# Patient Record
Sex: Female | Born: 1995 | Race: Black or African American | Hispanic: No | Marital: Single | Smoking: Former smoker
Health system: Southern US, Community
[De-identification: ages and names within clinical notes are randomized; demographics above are authoritative.]

## PROBLEM LIST (undated history)

## (undated) DIAGNOSIS — G43909 Migraine, unspecified, not intractable, without status migrainosus: Secondary | ICD-10-CM

---
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
## 2016-05-06 ENCOUNTER — Encounter (HOSPITAL_BASED_OUTPATIENT_CLINIC_OR_DEPARTMENT_OTHER): Payer: Self-pay | Admitting: Emergency Medicine

## 2016-05-06 ENCOUNTER — Emergency Department (HOSPITAL_BASED_OUTPATIENT_CLINIC_OR_DEPARTMENT_OTHER): Payer: BLUE CROSS/BLUE SHIELD

## 2016-05-06 ENCOUNTER — Emergency Department (HOSPITAL_BASED_OUTPATIENT_CLINIC_OR_DEPARTMENT_OTHER)
Admission: EM | Admit: 2016-05-06 | Discharge: 2016-05-06 | Disposition: A | Discharge status: Home / Self Care | Payer: BLUE CROSS/BLUE SHIELD | Attending: Emergency Medicine | Admitting: Emergency Medicine

## 2016-05-06 DIAGNOSIS — R0781 Pleurodynia: Secondary | ICD-10-CM | POA: Insufficient documentation

## 2016-05-06 DIAGNOSIS — O26899 Other specified pregnancy related conditions, unspecified trimester: Secondary | ICD-10-CM | POA: Insufficient documentation

## 2016-05-06 DIAGNOSIS — Z3490 Encounter for supervision of normal pregnancy, unspecified, unspecified trimester: Secondary | ICD-10-CM

## 2016-05-06 LAB — PREGNANCY, URINE: Preg Test, Ur: POSITIVE — AB

## 2016-05-06 MED ORDER — IBUPROFEN 400 MG PO TABS: 600.0000 mg | ORAL_TABLET | Freq: Once | ORAL | Status: DC

## 2016-05-06 MED ORDER — ACETAMINOPHEN 500 MG PO TABS
1000.0000 mg | ORAL_TABLET | Freq: Once | ORAL | Status: AC
  Administered 2016-05-06: 1000 mg via ORAL
  Filled 2016-05-06: qty 2

## 2016-05-06 NOTE — ED Triage Notes (Addendum)
Pt reports pain to R lower rib area, just under her bra strap, x 1 week. Pt reports recent sickness with forceful coughing and feels this is related. Pt also concerned of possible pregnancy

## 2016-05-06 NOTE — Discharge Instructions (Signed)
We believe that your symptoms are caused by musculoskeletal strain.  Please read through the included information about additional care such as heating pads, over-the-counter pain medicine.  If you were provided a prescription please use it only as needed and as instructed.  Remember that early mobility and using the affected part of your body is actually better than keeping it immobile.  You also tested positive for pregnancy. You will need to see an Ob/Gyn to establish care. Do not take Motrin while pregnant.   Follow-up with the doctor listed as recommended or return to the emergency department with new or worsening symptoms that concern you.

## 2016-05-06 NOTE — ED Provider Notes (Signed)
Emergency Department Provider Note   I have reviewed the triage vital signs and the nursing notes.   HISTORY  Chief Complaint Rib pain   HPI Yolanda Tate is a 21 y.o. female with no significant PMH presents to the emergency room in for evaluation of right-sided rib pain in the setting of recent URI and frequent coughing. Patient states that she is recovered from her cold and coughing has decreased significantly. She has noticed persistent right anterior rib pain. She is not taken any Tylenol or Motrin at home for these symptoms. She denies any suddenly worsening productive cough or fevers. No pain in any other location. Patient is also concerned that she may be pregnant like that evaluated as well. No abdominal pain.   History reviewed. No pertinent past medical history.  There are no active problems to display for this patient.   History reviewed. No pertinent surgical history.  Current Outpatient Rx  . Order #: [id number] Class: Print    Allergies Patient has no known allergies.  No family history on file.  Social History Social History  Substance Use Topics  . Smoking status: Never Smoker  . Smokeless tobacco: Never Used  . Alcohol use No    Review of Systems  Constitutional: No fever/chills Eyes: No visual changes. ENT: No sore throat. Cardiovascular: Denies chest pain. Respiratory: Denies shortness of breath. Gastrointestinal: No abdominal pain.  No nausea, no vomiting.  No diarrhea.  No constipation. Genitourinary: Negative for dysuria. Musculoskeletal: Negative for back pain. Positive right rib pain.  Skin: Negative for rash. Neurological: Negative for headaches, focal weakness or numbness.  10-point ROS otherwise negative.  ____________________________________________   PHYSICAL EXAM:  VITAL SIGNS: ED Triage Vitals  Enc Vitals Group     BP 05/06/16 0904 120/77     Pulse Rate 05/06/16 0904 105     Resp 05/06/16 0904 18     Temp 05/06/16  0904 98.2 F (36.8 C)     Temp Source 05/06/16 0904 Oral     SpO2 05/06/16 0904 100 %     Pain Score 05/06/16 0912 5   Constitutional: Alert and oriented. Well appearing and in no acute distress. Eyes: Conjunctivae are normal.  Head: Atraumatic. Nose: No congestion/rhinnorhea. Mouth/Throat: Mucous membranes are moist.  Oropharynx non-erythematous. Neck: No stridor.   Cardiovascular: Normal rate, regular rhythm. Good peripheral circulation. Grossly normal heart sounds.   Respiratory: Normal respiratory effort.  No retractions. Lungs CTAB. Gastrointestinal: Soft and nontender. No distention.  Musculoskeletal: No lower extremity tenderness nor edema. No gross deformities of extremities. Tenderness to palpation of the right anterior chest wall. No deformity, paradoxical movement, or bruising.  Neurologic:  Normal speech and language. No gross focal neurologic deficits are appreciated.  Skin:  Skin is warm, dry and intact. No rash noted.  ____________________________________________   LABS (all labs ordered are listed, but only abnormal results are displayed)  Labs Reviewed  PREGNANCY, URINE - Abnormal; Notable for the following:       Result Value   Preg Test, Ur POSITIVE (*)    All other components within normal limits   ____________________________________________  RADIOLOGY  Dg Ribs Unilateral W/chest Right  Result Date: 05/06/2016 CLINICAL DATA:  Cough for 2 weeks with onset of right rib pain from coughing 1 week ago. EXAM: RIGHT RIBS AND CHEST - 3+ VIEW COMPARISON:  None. FINDINGS: Lungs are clear. No pneumothorax or pleural effusion. Heart size is normal. No fracture or other bony abnormality. IMPRESSION: Negative examination. Electronically Signed  By: Drusilla Kannerhomas  Dalessio M.D.   On: 05/06/2016 09:38    ____________________________________________   PROCEDURES  Procedure(s) performed:   Procedures  None ____________________________________________   INITIAL  IMPRESSION / ASSESSMENT AND PLAN / ED COURSE  Pertinent labs & imaging results that were available during my care of the patient were reviewed by me and considered in my medical decision making (see chart for details).  Patient resents to the emergency department for evaluation of right anterior chest wall tenderness in the setting of recent coughing illness. Patient also concerned about possible pregnancy. Plan to obtain dedicated rib films of the right anterior chest and urine pregnancy test. Will give Tylenol for pain.  09:52 AM Patient has positive pregnancy test. X-ray negative for fracture. Advised Tylenol and warm compresses at home for pain. Provided follow-up OB/GYN care for the patient to call for an appointment today. Specifically discussed not using Motrin early pregnancy.   At this time, I do not feel there is any life-threatening condition present. I have reviewed and discussed all results (EKG, imaging, lab, urine as appropriate), exam findings with patient. I have reviewed nursing notes and appropriate previous records.  I feel the patient is safe to be discharged home without further emergent workup. Discussed usual and customary return precautions. Patient and family (if present) verbalize understanding and are comfortable with this plan.  Patient will follow-up with their primary care provider. If they do not have a primary care provider, information for follow-up has been provided to them. All questions have been answered.  ____________________________________________  FINAL CLINICAL IMPRESSION(S) / ED DIAGNOSES  Final diagnoses:  Rib pain on right side  Pregnancy, unspecified gestational age     MEDICATIONS GIVEN DURING THIS VISIT:  Medications  acetaminophen (TYLENOL) tablet 1,000 mg (1,000 mg Oral Given 05/06/16 0924)     NEW OUTPATIENT MEDICATIONS STARTED DURING THIS VISIT:  None   Note:  This document was prepared using Dragon voice recognition software and  may include unintentional dictation errors.  Alona BeneJoshua Omarion Minnehan, MD Emergency Medicine    Maia PlanJoshua G Cola Gane, MD 05/06/16 [phone]

## 2017-05-14 ENCOUNTER — Encounter (HOSPITAL_BASED_OUTPATIENT_CLINIC_OR_DEPARTMENT_OTHER): Payer: Self-pay | Admitting: Emergency Medicine

## 2017-05-14 ENCOUNTER — Other Ambulatory Visit: Payer: Self-pay

## 2017-05-14 ENCOUNTER — Emergency Department (HOSPITAL_BASED_OUTPATIENT_CLINIC_OR_DEPARTMENT_OTHER)
Admission: EM | Admit: 2017-05-14 | Discharge: 2017-05-14 | Disposition: A | Discharge status: Home / Self Care | Payer: BLUE CROSS/BLUE SHIELD | Attending: Emergency Medicine | Admitting: Emergency Medicine

## 2017-05-14 DIAGNOSIS — R1032 Left lower quadrant pain: Secondary | ICD-10-CM | POA: Diagnosis not present

## 2017-05-14 DIAGNOSIS — N939 Abnormal uterine and vaginal bleeding, unspecified: Secondary | ICD-10-CM | POA: Insufficient documentation

## 2017-05-14 LAB — URINALYSIS, ROUTINE W REFLEX MICROSCOPIC
BILIRUBIN URINE: NEGATIVE
GLUCOSE, UA: NEGATIVE mg/dL
Ketones, ur: NEGATIVE mg/dL
Leukocytes, UA: NEGATIVE
Nitrite: NEGATIVE
PH: 6.5 (ref 5.0–8.0)
Protein, ur: NEGATIVE mg/dL
SPECIFIC GRAVITY, URINE: 1.025 (ref 1.005–1.030)

## 2017-05-14 LAB — URINALYSIS, MICROSCOPIC (REFLEX): WBC, UA: NONE SEEN WBC/hpf (ref 0–5)

## 2017-05-14 LAB — CBC
HCT: 34.2 % — ABNORMAL LOW (ref 36.0–46.0)
Hemoglobin: 11.7 g/dL — ABNORMAL LOW (ref 12.0–15.0)
MCH: 27.1 pg (ref 26.0–34.0)
MCHC: 34.2 g/dL (ref 30.0–36.0)
MCV: 79.4 fL (ref 78.0–100.0)
Platelets: 276 10*3/uL (ref 150–400)
RBC: 4.31 MIL/uL (ref 3.87–5.11)
RDW: 16.6 % — AB (ref 11.5–15.5)
WBC: 5.9 10*3/uL (ref 4.0–10.5)

## 2017-05-14 LAB — COMPREHENSIVE METABOLIC PANEL
ALK PHOS: 39 U/L (ref 38–126)
ALT: 10 U/L — ABNORMAL LOW (ref 14–54)
AST: 21 U/L (ref 15–41)
Albumin: 3.8 g/dL (ref 3.5–5.0)
Anion gap: 9 (ref 5–15)
BUN: 9 mg/dL (ref 6–20)
CO2: 22 mmol/L (ref 22–32)
Calcium: 8.8 mg/dL — ABNORMAL LOW (ref 8.9–10.3)
Chloride: 106 mmol/L (ref 101–111)
Creatinine, Ser: 0.7 mg/dL (ref 0.44–1.00)
GFR calc Af Amer: >60 mL/min (ref >60)
Glucose, Bld: 106 mg/dL — ABNORMAL HIGH (ref 65–99)
POTASSIUM: 3.9 mmol/L (ref 3.5–5.1)
Sodium: 137 mmol/L (ref 135–145)
Total Bilirubin: 1 mg/dL (ref 0.3–1.2)
Total Protein: 7.4 g/dL (ref 6.5–8.1)

## 2017-05-14 MED ORDER — FERROUS FUMARATE 29 MG PO TABS: 1.0000 | ORAL_TABLET | Freq: Every day | ORAL | 0 refills | Status: DC

## 2017-05-14 NOTE — ED Notes (Signed)
Pt reports left side abd pain last night while working. Pt states she had a baby last year. Has been on her period with heavy bleeding.

## 2017-05-14 NOTE — ED Notes (Signed)
Pt given d/c instructions as per chart. Rx x 1. Verbalizes understanding. No questions. 

## 2017-05-14 NOTE — ED Notes (Signed)
ED Provider at bedside. Mia, PA

## 2017-05-14 NOTE — Discharge Instructions (Signed)
Your blood work showed a very, very mild anemia today. Your hemoglobin was 11.7 this is most likely due to your heavy menstrual cycle.  Take 1 tablet of iron daily before eating for the next 30 days.  Please schedule a follow-up appointment with your primary care provider in the next month to have your blood count rechecked.  You can apply a heated compress to the abdomen you develop abdominal pain.  Take 600 mg of ibuprofen with food every 8 hours for your symptoms.  If you develop new or worsening symptoms, including a high fever with abdominal pain, continued vaginal bleeding along with shortness of breath, change in the color of your skin, or fatigue, or vomiting and diarrhea, please return to the emergency department for re-evaluation.

## 2017-05-14 NOTE — ED Notes (Addendum)
Alert, NAD, calm, interactive, resps e/u, speaking in clear complete sentences, no dyspnea noted, skin W&D, (denies: pain, sob, NVD, fever, numbness, tingling, dizziness or visual changes). [facility].

## 2017-05-14 NOTE — ED Provider Notes (Signed)
[facility] EMERGENCY DEPARTMENT Provider Note   CSN: [id number] Arrival date & time: 05/14/17  1605     History   Chief Complaint Chief Complaint  Patient presents with  . Vaginal Bleeding    HPI Yolanda Tate is a 22 y.o. female who presents to the emergency department with a chief complaint of vaginal bleeding. LMP 05/11/17. She started on OCPs after giving birth to her son by c-section in 07/18.  She reports that instead of wearing sanitary pads or tampons after giving birth that she will wear Depends.  She states that she will normally soak through 1 dependence daily while she is on her period; however today she has used 2 Depends.   She was seen by her PCP 3 days ago and diagnosed with an left ovarian cyst by transvaginal US after she had been having intermittent LLQ pain for the last month.  She has no abdominal pain at this time, but reports crampy, severe, constant, non-radiating LLQ pain last night while she was at work.  She reports the pain was worse with ambulation and improved by nothing.  She has treated her symptoms with 600 mg of ibuprofen every 8 hours as prescribed by her PCP during her appointment earlier this week.  She denies dizziness, lightheadedness, or dyspnea.  She denies fever, chills, hematuria, dysuria, back pain, vaginal pain, nausea, vomiting, or diarrhea.  She is currently sexually active with one female partner. She denies vaginal discharge.   The history is provided by the patient. No language interpreter was used.    History reviewed. No pertinent past medical history.  There are no active problems to display for this patient.   History reviewed. No pertinent surgical history.  OB History    No data available       Home Medications    Prior to Admission medications   Medication Sig Start Date End Date Taking? Authorizing Provider  Ferrous Fumarate 29 MG TABS Take 1 tablet by mouth daily before breakfast. 05/14/17 06/13/17   Marky Buresh A, PA-C  ondansetron (ZOFRAN) 4 MG tablet Take 1 tablet (4 mg total) by mouth every 6 (six) hours. 05/03/14   Ladona Mow, PA-C    Family History History reviewed. No pertinent family history.  Social History Social History   Tobacco Use  . Smoking status: Never Smoker  . Smokeless tobacco: Never Used  Substance Use Topics  . Alcohol use: No  . Drug use: No     Allergies   Patient has no known allergies.   Review of Systems Review of Systems  Constitutional: Negative for activity change, chills and fever.  HENT: Negative for congestion.   Respiratory: Negative for shortness of breath.   Cardiovascular: Negative for chest pain.  Gastrointestinal: Positive for abdominal pain. Negative for blood in stool, diarrhea, nausea and vomiting.  Genitourinary: Positive for vaginal bleeding. Negative for vaginal discharge and vaginal pain.  Musculoskeletal: Negative for back pain.  Skin: Negative for rash.  Allergic/Immunologic: Negative for immunocompromised state.  Neurological: Negative for dizziness and light-headedness.     Physical Exam Updated Vital Signs BP 104/61 (BP Location: Right Arm)   Pulse 76   Temp 99.4 F (37.4 C) (Oral)   Resp 18   Ht 5\' 5"  (1.651 m)   Wt 59.9 kg (132 lb)   LMP 05/14/2017   SpO2 100%   BMI 21.97 kg/m   Physical Exam  Constitutional: No distress.  HENT:  Head: Normocephalic.  Eyes: Conjunctivae are normal. No  scleral icterus.  Neck: Normal range of motion. Neck supple.  Cardiovascular: Normal rate, regular rhythm, normal heart sounds and intact distal pulses. Exam reveals no gallop and no friction rub.  No murmur heard. Pulmonary/Chest: Effort normal and breath sounds normal. No stridor. No respiratory distress. She has no wheezes. She has no rales. She exhibits no tenderness.  Abdominal: Soft. Bowel sounds are normal. She exhibits no distension and no mass. There is no rebound and no guarding. No hernia.  Benign abdominal  exam without focal tenderness.   Lymphadenopathy:    She has no cervical adenopathy.  Neurological: She is alert.  Skin: Skin is warm. Capillary refill takes less than 2 seconds. No rash noted. She is not diaphoretic.  Psychiatric: Her behavior is normal.  Nursing note and vitals reviewed.    ED Treatments / Results  Labs (all labs ordered are listed, but only abnormal results are displayed) Labs Reviewed  CBC - Abnormal; Notable for the following components:      Result Value   Hemoglobin 11.7 (*)    HCT 34.2 (*)    RDW 16.6 (*)    All other components within normal limits  URINALYSIS, ROUTINE W REFLEX MICROSCOPIC - Abnormal; Notable for the following components:   Hgb urine dipstick MODERATE (*)    All other components within normal limits  COMPREHENSIVE METABOLIC PANEL - Abnormal; Notable for the following components:   Glucose, Bld 106 (*)    Calcium 8.8 (*)    ALT 10 (*)    All other components within normal limits  URINALYSIS, MICROSCOPIC (REFLEX) - Abnormal; Notable for the following components:   Bacteria, UA MANY (*)    Squamous Epithelial / LPF 0-5 (*)    All other components within normal limits    EKG  EKG Interpretation None       Radiology No results found.  Procedures Procedures (including critical care time)  Medications Ordered in ED Medications - No data to display   Initial Impression / Assessment and Plan / ED Course  I have reviewed the triage vital signs and the nursing notes.  Pertinent labs & imaging results that were available during my care of the patient were reviewed by me and considered in my medical decision making (see chart for details).     22 year old female presenting with abnormal uterine bleeding. She used two Depends today instead of one. She was diagnosed with a left ovarian cyst 4 days ago and had crampy abdominal pain last night at work, which has since resolved. UA with bacturia, but otherwise unremarkable. No urinary  sx, will not treat at this time. No leukocytosis. Hgb 11.7; previous Hgb 10.5 on 02/08/16 via [facility]. Labs are otherwise reassuring. Abdominal exam is unrmarkable. Doubt nephrolithiasis, ruptured ovarian cysts, diverticulitis, or colitis at this time. She is hemodynamically stable. NAD. Strict return precautions given. The patient is safe for d/c at this time.   Final Clinical Impressions(s) / ED Diagnoses   Final diagnoses:  Vaginal bleeding    ED Discharge Orders        Ordered    Ferrous Fumarate 29 MG TABS  Daily before breakfast     05/14/17 2045       Frederik PearMcDonald, Mercedez Boule A, PA-C 05/15/17 0322    Maia PlanLong, Joshua G, MD 05/15/17 1011

## 2017-05-14 NOTE — ED Triage Notes (Addendum)
Patient states that she is having vaginal bleeding - the patient states that she is saturating 2 adult diapers in a 24 hour period. Patient states that  Today is her last day of her period. Reports that she is having abdominal cramping as well. Denies any Dizziness or being lightheaded -  Patient was seen on [facility] and told that she has a cyst on her left ovary and was treated with Motrin

## 2017-09-24 ENCOUNTER — Other Ambulatory Visit: Payer: Self-pay

## 2017-09-24 ENCOUNTER — Emergency Department (HOSPITAL_BASED_OUTPATIENT_CLINIC_OR_DEPARTMENT_OTHER)
Admission: EM | Admit: 2017-09-24 | Discharge: 2017-09-24 | Disposition: A | Discharge status: Home / Self Care | Payer: Medicaid Other | Attending: Emergency Medicine | Admitting: Emergency Medicine

## 2017-09-24 ENCOUNTER — Encounter (HOSPITAL_BASED_OUTPATIENT_CLINIC_OR_DEPARTMENT_OTHER): Payer: Self-pay | Admitting: Emergency Medicine

## 2017-09-24 DIAGNOSIS — H11431 Conjunctival hyperemia, right eye: Secondary | ICD-10-CM | POA: Diagnosis present

## 2017-09-24 DIAGNOSIS — H1031 Unspecified acute conjunctivitis, right eye: Secondary | ICD-10-CM | POA: Diagnosis not present

## 2017-09-24 MED ORDER — POLYMYXIN B-TRIMETHOPRIM 10000-0.1 UNIT/ML-% OP SOLN: 1.0000 [drp] | OPHTHALMIC | 0 refills | Status: DC

## 2017-09-24 MED ORDER — TETRACAINE HCL 0.5 % OP SOLN
2.0000 [drp] | Freq: Once | OPHTHALMIC | Status: AC
  Administered 2017-09-24: 2 [drp] via OPHTHALMIC
  Filled 2017-09-24: qty 4

## 2017-09-24 MED ORDER — FLUORESCEIN SODIUM 1 MG OP STRP
1.0000 | ORAL_STRIP | Freq: Once | OPHTHALMIC | Status: AC
  Administered 2017-09-24: 1 via OPHTHALMIC
  Filled 2017-09-24: qty 1

## 2017-09-24 NOTE — Discharge Instructions (Signed)
Please apply antibiotic drops every 4 hours to right eye.  Remember that pinkeye is contagious and wash your hands frequently.  You can use warm compresses to help with drainage.  Follow-up with your eye doctor if your symptoms are not improving in 2 to 3 days.  Return to the emergency department if you have any new or concerning symptoms like trouble with your vision, fever greater than 100.4 F, worsening pain.

## 2017-09-24 NOTE — ED Triage Notes (Signed)
R eye pain and redness x 2 days.

## 2017-09-24 NOTE — ED Provider Notes (Signed)
[facility] EMERGENCY DEPARTMENT Provider Note   CSN: [id number] Arrival date & time: 09/24/17  1035     History   Chief Complaint Chief Complaint  Patient presents with  . Eye Pain    HPI Yolanda Tate is a 22 y.o. female.  HPI   Yolanda Tate is a 22yo female with no significant past medical history presents emergency department for evaluation of right eye.  Patient reports that she noticed her right eye was red yesterday morning.  She reports some mild soreness in the eye, about a 3/10 in severity.  States that she is also had some clear drainage and has been rubbing the eye.  Denies trouble with her vision.  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  She denies any recent eye trauma or feeling of something stuck in that eye.  She is not a contact lens wearer.  States that she occasionally wears glasses.  Denies previous eye surgeries.  Denies fevers, chills, photophobia, headache, painful EOMs.  History reviewed. No pertinent past medical history.  There are no active problems to display for this patient.   Past Surgical History:  Procedure Laterality Date  . CESAREAN SECTION       OB History   None      Home Medications    Prior to Admission medications   Medication Sig Start Date End Date Taking? Authorizing Provider  Ferrous Fumarate 29 MG TABS Take 1 tablet by mouth daily before breakfast. 05/14/17 06/13/17  McDonald, Mia A, PA-C  ondansetron (ZOFRAN) 4 MG tablet Take 1 tablet (4 mg total) by mouth every 6 (six) hours. 05/03/14   Ladona Mow, PA-C    Family History No family history on file.  Social History Social History   Tobacco Use  . Smoking status: Never Smoker  . Smokeless tobacco: Never Used  Substance Use Topics  . Alcohol use: No  . Drug use: No     Allergies   Patient has no known allergies.   Review of Systems Review of Systems    Constitutional: Negative for chills and fever.  HENT: Negative for facial swelling.   Eyes: Positive for pain, discharge and redness. Negative for photophobia and visual disturbance.  Gastrointestinal: Negative for nausea and vomiting.  Neurological: Negative for headaches.     Physical Exam Updated Vital Signs BP 104/71 (BP Location: Right Arm)   Pulse 96   Temp 99.7 F (37.6 C) (Oral)   Resp 18   Ht  (1.651 m)   Wt 59 kg (130 lb)   LMP 08/31/2017   SpO2 99%   BMI 21.63 kg/m   Physical Exam  Constitutional: She appears well-developed and well-nourished. No distress.  HENT:  Head: Normocephalic and atraumatic.  Eyes: Right eye exhibits no discharge. Left eye exhibits no discharge.  Visual Acuity: 20/20RE, 20/20LE Appearance. Right eye with conjunctival injection and clear drainage.  Left eye without conjunctival injection. PERRL intact. EOMI without nystagmus or pain. No consensual photophobia.  Corneal Abrasion Exam VCO. Risks, benefits and alternatives explained. 2 drops of tetracaine (PONTOCAINE) 0.5 % ophthalmic solution were applied to the right eye. Fluorescein 1 MG ophthalmic strip applied the the surface of the right eye Slit lamp used to screen for abrasion. No increased fluorescein uptake. No corneal ulcer. No hyphema. No dendritic lesion. Negative Seidel sign. No foreign bodies noted. Patient tolerated the procedure well  Pulmonary/Chest:  Effort normal. No respiratory distress.  Neurological: She is alert. Coordination normal.  Skin: She is not diaphoretic.  Psychiatric: She has a normal mood and affect. Her behavior is normal.  Nursing note and vitals reviewed.    ED Treatments / Results  Labs (all labs ordered are listed, but only abnormal results are displayed) Labs Reviewed - No data to display  EKG None  Radiology No results found.  Procedures Procedures (including critical care time)  Medications Ordered in ED Medications - No data to  display   Initial Impression / Assessment and Plan / ED Course  I have reviewed the triage vital signs and the nursing notes.  Pertinent labs & imaging results that were available during my care of the patient were reviewed by me and considered in my medical decision making (see chart for details).     Patient presentation consistent with conjunctivitis.  No corneal abrasions, entrapment, consensual photophobia, or dendritic staining with fluorescein study.  Presentation non-concerning for iritis, corneal abrasion, corneal ulcer, or HSV.  She is not a contact lens wearer. Will cover for bacterial conjunctivitis with polytrim drops.  Personal hygiene and frequent handwashing discussed.  Patient advised to followup with ophthalmologist if symptoms persist or worsen in any way including vision change or purulent discharge.  Patient verbalizes understanding and is agreeable with discharge.  Final Clinical Impressions(s) / ED Diagnoses   Final diagnoses:  Acute conjunctivitis of right eye, unspecified acute conjunctivitis type    ED Discharge Orders        Ordered    trimethoprim-polymyxin b (POLYTRIM) ophthalmic solution  Every 4 hours     09/24/17 1417       Kellie Shropshire, PA-C 09/24/17 1424    Tilden Fossa, MD 09/25/17 [phone]

## 2017-10-26 ENCOUNTER — Other Ambulatory Visit: Payer: Self-pay

## 2017-10-26 ENCOUNTER — Encounter (HOSPITAL_BASED_OUTPATIENT_CLINIC_OR_DEPARTMENT_OTHER): Payer: Self-pay | Admitting: *Deleted

## 2017-10-26 ENCOUNTER — Emergency Department (HOSPITAL_BASED_OUTPATIENT_CLINIC_OR_DEPARTMENT_OTHER)
Admission: EM | Admit: 2017-10-26 | Discharge: 2017-10-26 | Disposition: A | Discharge status: Home / Self Care | Payer: Medicaid Other | Attending: Emergency Medicine | Admitting: Emergency Medicine

## 2017-10-26 DIAGNOSIS — R6884 Jaw pain: Secondary | ICD-10-CM | POA: Diagnosis present

## 2017-10-26 DIAGNOSIS — M26623 Arthralgia of bilateral temporomandibular joint: Secondary | ICD-10-CM | POA: Insufficient documentation

## 2017-10-26 MED ORDER — IBUPROFEN 400 MG PO TABS
600.0000 mg | ORAL_TABLET | Freq: Once | ORAL | Status: AC
  Administered 2017-10-26: 600 mg via ORAL
  Filled 2017-10-26: qty 1

## 2017-10-26 NOTE — Discharge Instructions (Signed)
Please take Ibuprofen or Aleve for pain Please follow up with your doctor Avoid crunchy, chewy, or sticky foods for the next couple days until your pain improves

## 2017-10-26 NOTE — ED Provider Notes (Signed)
[facility] EMERGENCY DEPARTMENT Provider Note   CSN: [id number] Arrival date & time: 10/26/17  1715     History   Chief Complaint Chief Complaint  Patient presents with  . Jaw Pain    HPI Rada Zegers is a 22 y.o. female who presents with bilateral jaw pain.  Past medical history significant for TMJ disorder.  She states she went to the dentist a couple days ago for filling and when she left the office she felt her L jaw pop. She reports pain over the bilateral TMJ joints. She denies dental pain or facial swelling, or trauma. She states she just wanted to get it checked out because 1 year ago her doctor told her she may have to get a rod in her jaw. She's been taking Tylenol for pain.  HPI  History reviewed. No pertinent past medical history.  There are no active problems to display for this patient.   Past Surgical History:  Procedure Laterality Date  . CESAREAN SECTION       OB History   None      Home Medications    Prior to Admission medications   Not on File    Family History History reviewed. No pertinent family history.  Social History Social History   Tobacco Use  . Smoking status: Never Smoker  . Smokeless tobacco: Never Used  Substance Use Topics  . Alcohol use: No  . Drug use: No     Allergies   Patient has no known allergies.   Review of Systems Review of Systems  HENT: Negative for dental problem and facial swelling.   Musculoskeletal: Positive for arthralgias.     Physical Exam Updated Vital Signs BP 108/74   Pulse (!) 109   Temp 99.5 F (37.5 C)   Resp 18   Ht 5\' 5"  (1.651 m)   Wt 59 kg (130 lb)   LMP 10/19/2017   SpO2 100%   BMI 21.63 kg/m   Physical Exam  Constitutional: She is oriented to person, place, and time. She appears well-developed and well-nourished. No distress.  Calm, cooperative. Talking normally.  HENT:  Head: Normocephalic and atraumatic.  Mouth/Throat: No oral lesions. There is  trismus (mild) in the jaw. Abnormal dentition (multiple fillings). No dental abscesses.  Eyes: Pupils are equal, round, and reactive to light. Conjunctivae are normal. Right eye exhibits no discharge. Left eye exhibits no discharge. No scleral icterus.  Neck: Normal range of motion.  Cardiovascular: Normal rate.  Pulmonary/Chest: Effort normal. No respiratory distress.  Abdominal: She exhibits no distension.  Neurological: She is alert and oriented to person, place, and time.  Skin: Skin is warm and dry.  Psychiatric: She has a normal mood and affect. Her behavior is normal.  Nursing note and vitals reviewed.    ED Treatments / Results  Labs (all labs ordered are listed, but only abnormal results are displayed) Labs Reviewed - No data to display  EKG None  Radiology No results found.  Procedures Procedures (including critical care time)  Medications Ordered in ED Medications  ibuprofen (ADVIL,MOTRIN) tablet 600 mg (has no administration in time range)     Initial Impression / Assessment and Plan / ED Course  I have reviewed the triage vital signs and the nursing notes.  Pertinent labs & imaging results that were available during my care of the patient were reviewed by me and considered in my medical decision making (see chart for details).  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 She has mild trismus but otherwise has a normal exam. Recommended NSAIDs and PCP follow up. She was offered imaging of the area but declined.  Final Clinical Impressions(s) / ED Diagnoses   Final diagnoses:  Bilateral temporomandibular joint pain    ED Discharge Orders    None       Bethel BornGekas, Gracianna Vink Marie, PA-C 10/26/17 1821    Melene PlanFloyd, Dan, DO 10/26/17 1914

## 2017-10-26 NOTE — ED Triage Notes (Addendum)
Pt c/o bil jaw pain after dentist visit x 2 days ago . HX TMJ.  Pt did not f/u with dentist

## 2017-12-30 ENCOUNTER — Emergency Department (HOSPITAL_BASED_OUTPATIENT_CLINIC_OR_DEPARTMENT_OTHER)
Admission: EM | Admit: 2017-12-30 | Discharge: 2017-12-31 | Disposition: A | Discharge status: Home / Self Care | Payer: Medicaid Other | Attending: Emergency Medicine | Admitting: Emergency Medicine

## 2017-12-30 ENCOUNTER — Other Ambulatory Visit: Payer: Self-pay

## 2017-12-30 ENCOUNTER — Encounter (HOSPITAL_BASED_OUTPATIENT_CLINIC_OR_DEPARTMENT_OTHER): Payer: Self-pay | Admitting: Adult Health

## 2017-12-30 DIAGNOSIS — R112 Nausea with vomiting, unspecified: Secondary | ICD-10-CM | POA: Diagnosis not present

## 2017-12-30 DIAGNOSIS — R197 Diarrhea, unspecified: Secondary | ICD-10-CM

## 2017-12-30 NOTE — ED Triage Notes (Signed)
PResents with vomiting and diarrhea for 3 days. LAst emesis was at 10 AM today. Currently having diarrhea 4 times per day. Endorses stomach cramping. Denies fevers.

## 2017-12-31 MED ORDER — ONDANSETRON 4 MG PO TBDP: 4.0000 mg | ORAL_TABLET | Freq: Three times a day (TID) | ORAL | 0 refills | Status: AC | PRN

## 2017-12-31 MED ORDER — ONDANSETRON 4 MG PO TBDP: 4.0000 mg | ORAL_TABLET | Freq: Three times a day (TID) | ORAL | 0 refills | Status: DC | PRN

## 2017-12-31 NOTE — ED Provider Notes (Signed)
[facility] EMERGENCY DEPARTMENT Provider Note  CSN: [id number] Arrival date & time: 12/30/17 2230  Chief Complaint(s) Diarrhea  HPI Yolanda Tate is a 22 y.o. female   The history is provided by the patient.  Diarrhea   This is a new problem. Episode onset: 3 days. The problem occurs 2 to 4 times per day. The problem has not changed since onset.The stool consistency is described as watery. There has been no fever. Associated symptoms include abdominal pain (cramping lower abd pain) and vomiting (NBNB, twice). Pertinent negatives include no chills, no myalgias, no URI and no cough.   No recent travel, or sick contacts. Possible suspicious food intake.  Past Medical History History reviewed. No pertinent past medical history. There are no active problems to display for this patient.  Home Medication(s) Prior to Admission medications   Medication Sig Start Date End Date Taking? Authorizing Provider  ondansetron (ZOFRAN ODT) 4 MG disintegrating tablet Take 1 tablet (4 mg total) by mouth every 8 (eight) hours as needed for up to 3 days for nausea or vomiting. 12/31/17 01/03/18  Cardama, Amadeo Garnet, MD                                                                                                                                    Past Surgical History Past Surgical History:  Procedure Laterality Date  . CESAREAN SECTION     Family History History reviewed. No pertinent family history.  Social History Social History   Tobacco Use  . Smoking status: Never Smoker  . Smokeless tobacco: Never Used  Substance Use Topics  . Alcohol use: No  . Drug use: No   Allergies Patient has no known allergies.  Review of Systems Review of Systems  Constitutional: Negative for chills.  Respiratory: Negative for cough.   Gastrointestinal: Positive for abdominal pain (cramping lower abd pain), diarrhea and vomiting (NBNB, twice).  Musculoskeletal: Negative for myalgias.    All other systems are reviewed and are negative for acute change except as noted in the HPI  Physical Exam Vital Signs  I have reviewed the triage vital signs BP 108/68 (BP Location: Left Arm)   Pulse 93   Temp 98.5 F (36.9 C) (Oral)   Resp 18   Ht 5\' 5"  (1.651 m)   Wt 57.6 kg   LMP 12/14/2017   SpO2 98%   BMI 21.12 kg/m   Physical Exam  Constitutional: She is oriented to person, place, and time. She appears well-developed and well-nourished. No distress.  HENT:  Head: Normocephalic and atraumatic.  Right Ear: External ear normal.  Left Ear: External ear normal.  Nose: Nose normal.  Eyes: Conjunctivae and EOM are normal. No scleral icterus.  Neck: Normal range of motion and phonation normal.  Cardiovascular: Normal rate and regular rhythm.  Pulmonary/Chest: Effort normal. No stridor. No respiratory distress.  Abdominal: She exhibits no distension. There is no tenderness. There  is no rigidity, no rebound and no guarding.  Musculoskeletal: Normal range of motion. She exhibits no edema.  Neurological: She is alert and oriented to person, place, and time.  Skin: She is not diaphoretic.  Psychiatric: She has a normal mood and affect. Her behavior is normal.  Vitals reviewed.   ED Results and Treatments Labs (all labs ordered are listed, but only abnormal results are displayed) Labs Reviewed - No data to display                                                                                                                       EKG  EKG Interpretation  Date/Time:    Ventricular Rate:    PR Interval:    QRS Duration:   QT Interval:    QTC Calculation:   R Axis:     Text Interpretation:        Radiology No results found. Pertinent labs & imaging results that were available during my care of the patient were reviewed by me and considered in my medical decision making (see chart for details).  Medications Ordered in ED Medications - No data to display                                                                                                                                   Procedures Procedures  (including critical care time)  Medical Decision Making / ED Course I have reviewed the nursing notes for this encounter and the patient's prior records (if available in EHR or on provided paperwork).    22 y.o. female presents with vomiting, diarrhea, and fever for 3 days.  possible suspicious food intake. No historical evidence to suggest suspicious toxic ingestion or exposure. adequate oral tolerance. Rest of history as above.  Patient appears well, not in distress, and with no signs of toxicity or dehydration. Abdomen benign.  Rest of the exam as above  Most consistent with viral gastroenteritis vs food poisoning.   No evidence of suggestive of cholinergic toxidrome. Doubt appendicitis, diverticulitis, severe colitis, dysentery.    Able to tolerate oral intake in the ED.  Discussed symptomatic treatment with the patient and they will follow closely with their PCP.   Final Clinical Impression(s) / ED Diagnoses Final diagnoses:  Nausea vomiting and diarrhea    Disposition: Discharge  Condition: Good  I have discussed the results, Dx and Tx plan with the patient  who expressed understanding and agree(s) with the plan. Discharge instructions discussed at great length. The patient was given strict return precautions who verbalized understanding of the instructions. No further questions at time of discharge.    ED Discharge Orders         Ordered    ondansetron (ZOFRAN ODT) 4 MG disintegrating tablet  Every 8 hours PRN     12/31/17 0011           Follow Up: Primary care provider   in 5-7 days, If symptoms do not improve or  worsen     This chart was dictated using voice recognition software.  Despite best efforts to proofread,  errors can occur which can change the documentation meaning.   Nira Connardama, Pedro Eduardo, MD 12/31/17  [phone]

## 2018-08-27 IMAGING — DX DG RIBS W/ CHEST 3+V*R*
3 series · 3 of 3 positions shown · non-contrast
Comparison: None.

CLINICAL DATA: Cough for 2 weeks with onset of right rib pain from
coughing 1 week ago.

EXAM:
RIGHT RIBS AND CHEST - 3+ VIEW

[chest pa]
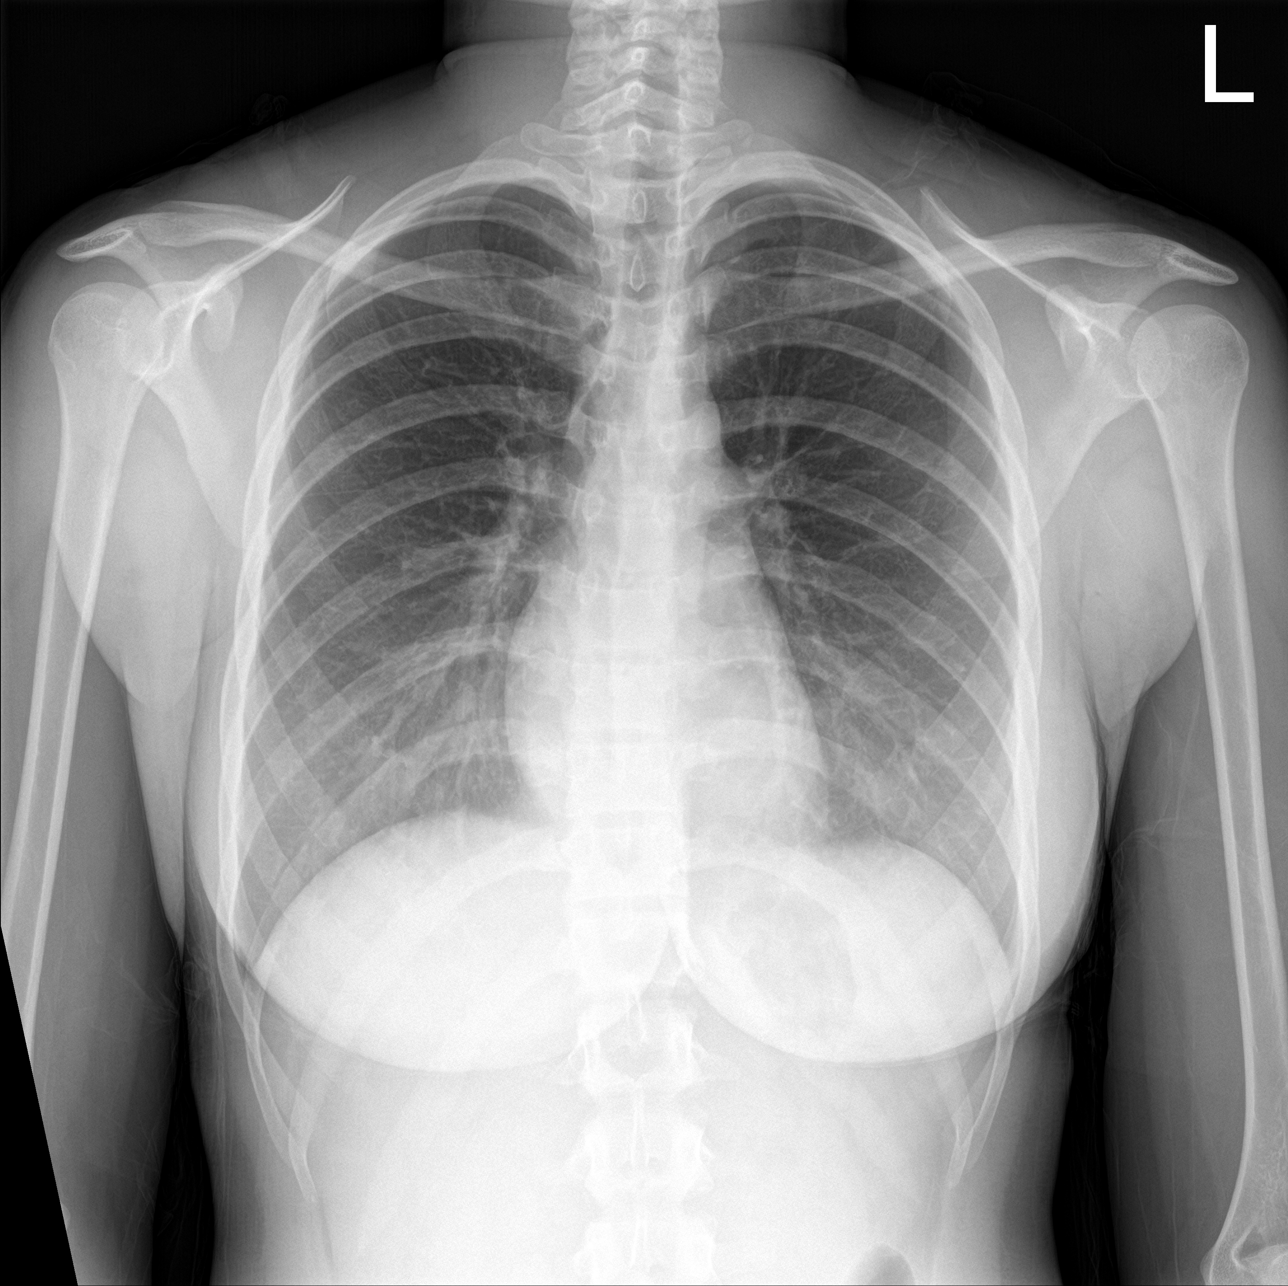

[rib pa]
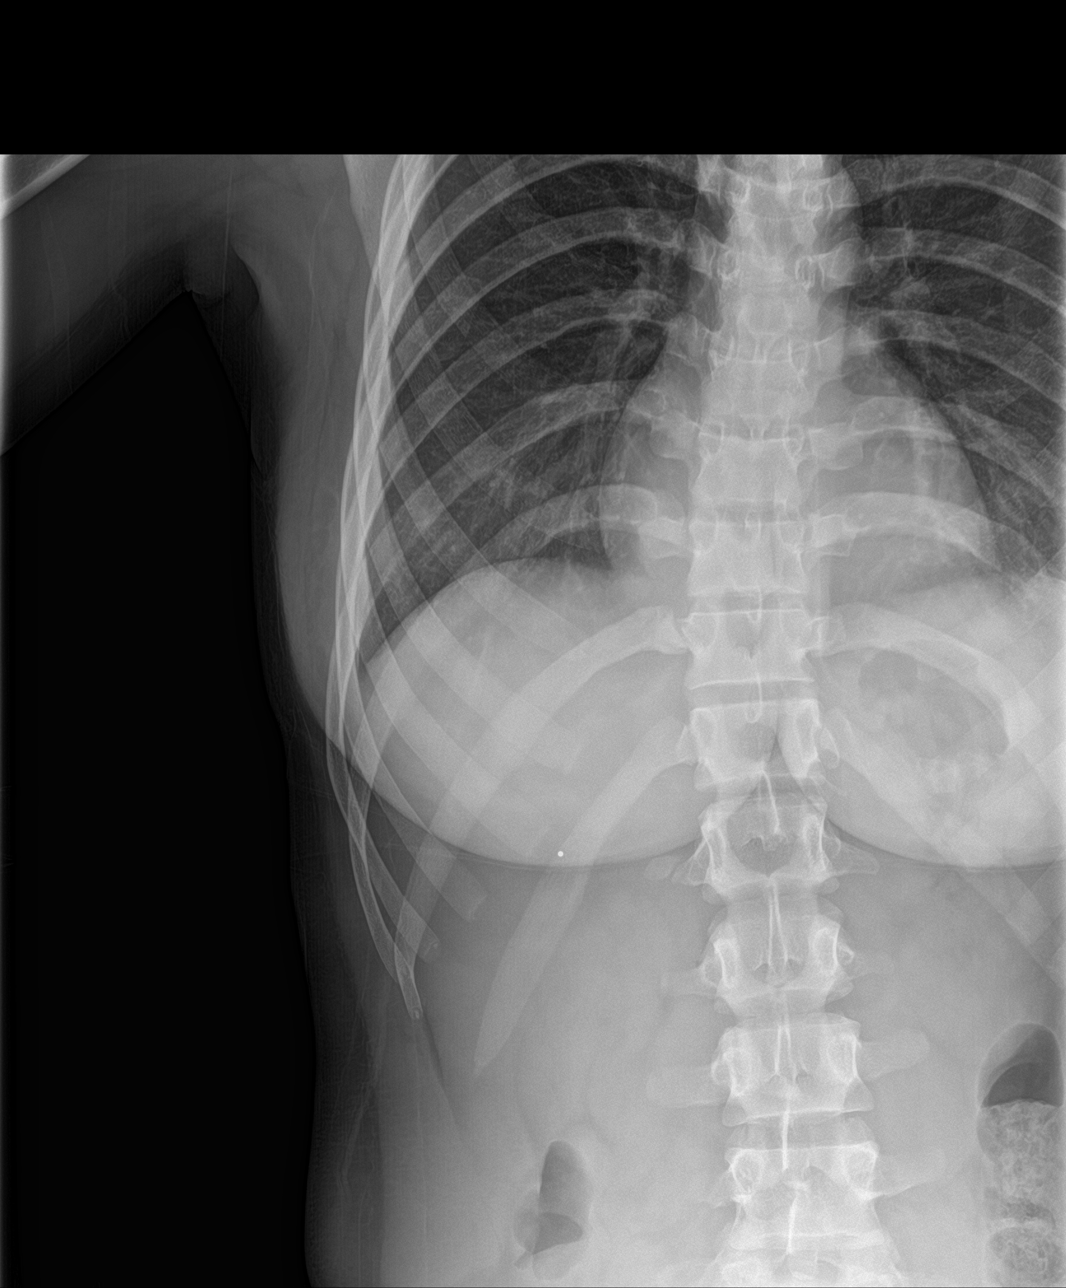

[rib pa obl]
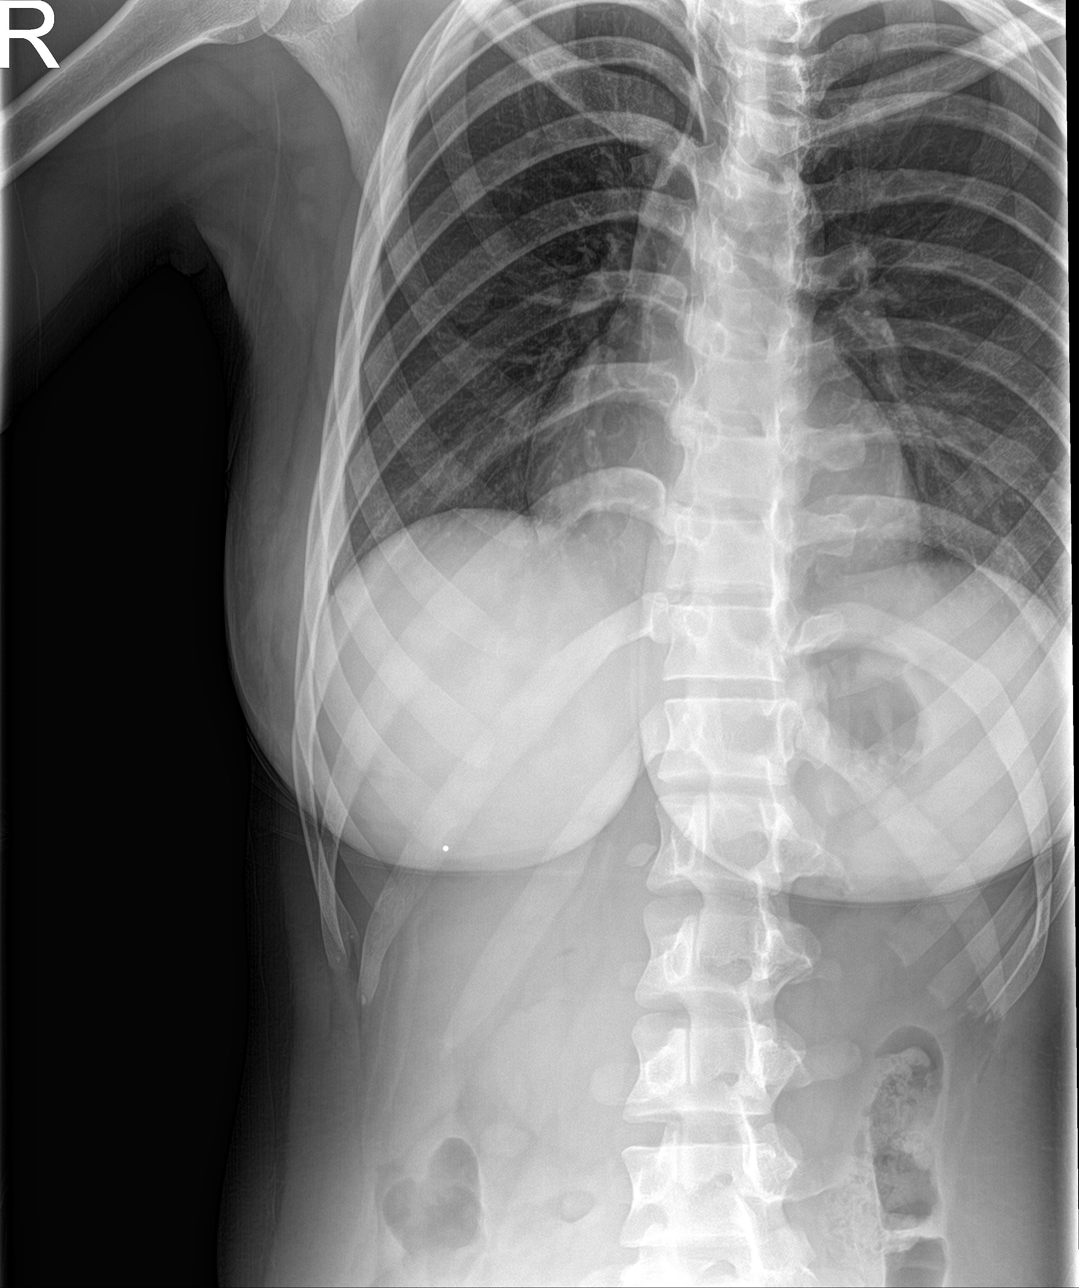

[3 of 3 positions shown; findings below may reference images not displayed]

FINDINGS: Lungs are clear. No pneumothorax or pleural effusion. Heart size is
normal. No fracture or other bony abnormality.
IMPRESSION: Negative examination.

## 2019-06-01 ENCOUNTER — Emergency Department (HOSPITAL_BASED_OUTPATIENT_CLINIC_OR_DEPARTMENT_OTHER)
Admission: EM | Admit: 2019-06-01 | Discharge: 2019-06-01 | Disposition: A | Discharge status: Home / Self Care | Payer: Medicaid Other | Attending: Emergency Medicine | Admitting: Emergency Medicine

## 2019-06-01 ENCOUNTER — Encounter (HOSPITAL_BASED_OUTPATIENT_CLINIC_OR_DEPARTMENT_OTHER): Payer: Self-pay

## 2019-06-01 ENCOUNTER — Other Ambulatory Visit: Payer: Self-pay

## 2019-06-01 DIAGNOSIS — G43909 Migraine, unspecified, not intractable, without status migrainosus: Secondary | ICD-10-CM | POA: Insufficient documentation

## 2019-06-01 DIAGNOSIS — F17200 Nicotine dependence, unspecified, uncomplicated: Secondary | ICD-10-CM | POA: Insufficient documentation

## 2019-06-01 DIAGNOSIS — G43809 Other migraine, not intractable, without status migrainosus: Secondary | ICD-10-CM

## 2019-06-01 HISTORY — DX: Migraine, unspecified, not intractable, without status migrainosus: G43.909

## 2019-06-01 LAB — PREGNANCY, URINE: Preg Test, Ur: NEGATIVE

## 2019-06-01 MED ORDER — KETOROLAC TROMETHAMINE 30 MG/ML IJ SOLN
30.0000 mg | Freq: Once | INTRAMUSCULAR | Status: AC
  Administered 2019-06-01: 30 mg via INTRAMUSCULAR
  Filled 2019-06-01: qty 1

## 2019-06-01 NOTE — Discharge Instructions (Signed)
Please follow up with [facility] for your primary care needs  Take Naproxen as needed for your headaches if it seems to help. Increase your fluid intake to stay hydrated  Return to the ED IMMEDIATELY for any worsening symptoms including worsening headache, blurry vision/double vision, vomiting, neck stiffness, purple rash on your legs, confusion, unilateral weakness or numbness, speech difficulties, fevers > 100.4, or any other concerning symptoms to you.

## 2019-06-01 NOTE — ED Triage Notes (Addendum)
Pt c/o migraine x 2 months-states she was dx at age 24-no meds-denies fever/flu like sx-NAD-steady gait

## 2019-06-01 NOTE — ED Provider Notes (Signed)
[facility] EMERGENCY DEPARTMENT Provider Note   CSN: [id number] Arrival date & time: 06/01/19  1228     History Chief Complaint  Patient presents with  . Migraine    Yolanda Tate is a 24 y.o. female with PMHx migraines who presents to the ED today complaining of gradual onset, intermittent, diffuse migraine headache x 3 months.  She reports that she was diagnosed with migraine headaches around the age of 23-13.  She states that the time she had been taking 600 mg ibuprofen as needed for her headaches which helped.  She reports that she had a child in 2018 and reports that she stopped taking ibuprofen at that time, reports that her migraine headaches resolved while she was pregnant.  She states that they returned about 3 months ago.  She reports that she has been taking naproxen with mild relief however today while at work she felt lightheaded which is not typical for her.  She states that her mom told her to come to the ED to be evaluated.  Patient states that she typically does not have migraine headaches at work and reports that she did not drink a lot of water before going to work.  Patient works at a [employer] and is constantly up and down on her feet.  She reports that the most recent headache started last night and is slowly dissipating, states that this is not the most severe headache she has ever had.  Ports that she did try to take some ibuprofen earlier today without relief.  Patient denies fevers, chills, neck stiffness, rash, vision changes, vomiting, confusion, speech difficulties, unilateral weakness or numbness, any other associated symptoms.  The history is provided by the patient.       Past Medical History:  Diagnosis Date  . Migraine     There are no problems to display for this patient.   Past Surgical History:  Procedure Laterality Date  . CESAREAN SECTION       OB History   No obstetric history on file.     No family history on  file.  Social History   Tobacco Use  . Smoking status: Current Every Day Smoker    Types: Cigars  . Smokeless tobacco: Never Used  Substance Use Topics  . Alcohol use: No  . Drug use: No    Home Medications Prior to Admission medications   Not on File    Allergies    Patient has no known allergies.  Review of Systems   Review of Systems  Constitutional: Negative for chills and fever.  Gastrointestinal: Positive for nausea. Negative for abdominal pain, constipation, diarrhea and vomiting.  Musculoskeletal: Negative for myalgias, neck pain and neck stiffness.  Neurological: Positive for light-headedness and headaches. Negative for dizziness, syncope, speech difficulty, weakness and numbness.  All other systems reviewed and are negative.   Physical Exam Updated Vital Signs BP (!) 115/52 (BP Location: Left Arm)   Pulse 80   Temp 98.8 F (37.1 C) (Oral)   Resp 14   Ht 5\' 5"  (1.651 m)   Wt 63 kg   LMP 05/31/2019   SpO2 100%   BMI 23.13 kg/m   Physical Exam Vitals and nursing note reviewed.  Constitutional:      Appearance: She is not ill-appearing or diaphoretic.  HENT:     Head: Normocephalic and atraumatic.  Eyes:     Conjunctiva/sclera: Conjunctivae normal.  Cardiovascular:     Rate and Rhythm: Normal rate and regular  rhythm.     Pulses: Normal pulses.  Pulmonary:     Effort: Pulmonary effort is normal.     Breath sounds: Normal breath sounds. No wheezing, rhonchi or rales.  Abdominal:     Palpations: Abdomen is soft.     Tenderness: There is no abdominal tenderness.  Musculoskeletal:     Cervical back: Neck supple.  Skin:    General: Skin is warm and dry.  Neurological:     Mental Status: She is alert.     Comments: CN 3-12 grossly intact A&O x4 GCS 15 Sensation and strength intact Gait nonataxic including with tandem walking Coordination with finger-to-nose WNL Neg romberg, neg pronator drift     ED Results / Procedures / Treatments    Labs (all labs ordered are listed, but only abnormal results are displayed) Labs Reviewed  PREGNANCY, URINE    EKG None  Radiology No results found.  Procedures Procedures (including critical care time)  Medications Ordered in ED Medications  ketorolac (TORADOL) 30 MG/ML injection 30 mg (30 mg Intramuscular Given 06/01/19 1458)    ED Course  I have reviewed the triage vital signs and the nursing notes.  Pertinent labs & imaging results that were available during my care of the patient were reviewed by me and considered in my medical decision making (see chart for details).  24 year old female who presents the ED today complaining of intermittent migraines for the past 2 to 3 months.  Diagnosed at the age of 78.  Does not currently have a PCP.  Orts that she began feeling lightheaded this morning prompting her to come to the ED.  Does currently have a headache however states it is more mild than it normally is.  She states that these headaches feel very similar to when she was younger, do not feel she needs any imaging at this time.  Vitals are stable, patient is afebrile, nontachycardic and nontachypneic.  Has no focal neuro deficits or meningeal signs on exam today.  Will check a urine pregnancy test and give medication for headache.  Patient will need a PCP for further evaluation, she is in agreement with this.  Will check orthostatic vital signs given complaint of lightheadedness.   Urine preg negative. Toradol given for headache; pt drove herself here - will hold off on sedating meds including benadryl  Orthostatics within normal limits.   On reevaluation after Toradol patient states her headache is barely there.  She states she is ready to go home.  We will give her follow-up with [facility] for primary care needs that she does not currently have a PCP.  Strict return precautions have been discussed with patient.  She is in agreement with plan and stable for  discharge home.    MDM Rules/Calculators/A&P                       Final Clinical Impression(s) / ED Diagnoses Final diagnoses:  Other migraine without status migrainosus, not intractable    Rx / DC Orders ED Discharge Orders    None       Discharge Instructions     Please follow up with [facility] for your primary care needs  Take Naproxen as needed for your headaches if it seems to help. Increase your fluid intake to stay hydrated  Return to the ED IMMEDIATELY for any worsening symptoms including worsening headache, blurry vision/double vision, vomiting, neck stiffness, purple rash on your legs, confusion,  unilateral weakness or numbness, speech difficulties, fevers > 100.4, or any other concerning symptoms to you.         Eustaquio Maize, PA-C 06/01/19 [place], [place], MD 06/02/19 [phone]

## 2020-05-02 ENCOUNTER — Encounter (HOSPITAL_BASED_OUTPATIENT_CLINIC_OR_DEPARTMENT_OTHER): Payer: Self-pay | Admitting: Emergency Medicine

## 2020-05-02 ENCOUNTER — Emergency Department (HOSPITAL_BASED_OUTPATIENT_CLINIC_OR_DEPARTMENT_OTHER)
Admission: EM | Admit: 2020-05-02 | Discharge: 2020-05-02 | Disposition: A | Discharge status: Home / Self Care | Payer: PRIVATE HEALTH INSURANCE | Attending: Emergency Medicine | Admitting: Emergency Medicine

## 2020-05-02 ENCOUNTER — Other Ambulatory Visit: Payer: Self-pay

## 2020-05-02 DIAGNOSIS — Z20822 Contact with and (suspected) exposure to covid-19: Secondary | ICD-10-CM

## 2020-05-02 DIAGNOSIS — R059 Cough, unspecified: Secondary | ICD-10-CM | POA: Diagnosis present

## 2020-05-02 DIAGNOSIS — J069 Acute upper respiratory infection, unspecified: Secondary | ICD-10-CM

## 2020-05-02 DIAGNOSIS — F17290 Nicotine dependence, other tobacco product, uncomplicated: Secondary | ICD-10-CM | POA: Diagnosis not present

## 2020-05-02 DIAGNOSIS — U071 COVID-19: Secondary | ICD-10-CM | POA: Insufficient documentation

## 2020-05-02 LAB — SARS CORONAVIRUS 2 (TAT 6-24 HRS): SARS Coronavirus 2: POSITIVE — AB

## 2020-05-02 MED ORDER — ONDANSETRON HCL 4 MG PO TABS: 4.0000 mg | ORAL_TABLET | Freq: Three times a day (TID) | ORAL | 0 refills | Status: DC | PRN

## 2020-05-02 MED ORDER — ACETAMINOPHEN 325 MG PO TABS
650.0000 mg | ORAL_TABLET | Freq: Once | ORAL | Status: AC
  Administered 2020-05-02: 650 mg via ORAL

## 2020-05-02 MED ORDER — BENZONATATE 100 MG PO CAPS: 100.0000 mg | ORAL_CAPSULE | Freq: Three times a day (TID) | ORAL | 0 refills | Status: DC

## 2020-05-02 MED ORDER — ACETAMINOPHEN 325 MG PO TABS
ORAL_TABLET | ORAL | Status: AC
  Filled 2020-05-02: qty 2

## 2020-05-02 NOTE — ED Triage Notes (Signed)
Cough, nasal congestion and vomiting since Monday.

## 2020-05-02 NOTE — ED Provider Notes (Signed)
[facility] EMERGENCY DEPARTMENT Provider Note   CSN: [id number] Arrival date & time: 05/02/20  0214     History Chief Complaint  Patient presents with  . Cough    Yolanda Tate is a 24 y.o. female.  The history is provided by the patient.  Cough  Yolanda Tate is a 24 y.o. female who presents to the Emergency Department complaining of cough. She started feeling poorly four days ago with cough and nasal congestion.  Cough is productive of mucous.  Last night she began to feel significantly worse with increased coughing as well as emesis.  Started running a fever last night.  Her second emesis had small amounts of blood.    No sob, abdominal pain, dysuria, diarrhea, skin rash, sore throat.    Has no known medical problems.  Takes no medications.  Has not been vaccinated for COVID19.    Does not believe she is pregnant.      Past Medical History:  Diagnosis Date  . Migraine     There are no problems to display for this patient.   Past Surgical History:  Procedure Laterality Date  . CESAREAN SECTION       OB History   No obstetric history on file.     History reviewed. No pertinent family history.  Social History   Tobacco Use  . Smoking status: Current Every Day Smoker    Types: Cigars  . Smokeless tobacco: Never Used  Vaping Use  . Vaping Use: Never used  Substance Use Topics  . Alcohol use: No  . Drug use: No    Home Medications Prior to Admission medications   Medication Sig Start Date End Date Taking? Authorizing Provider  benzonatate (TESSALON) 100 MG capsule Take 1 capsule (100 mg total) by mouth every 8 (eight) hours. 05/02/20  Yes Tilden Fossa, MD  ondansetron (ZOFRAN) 4 MG tablet Take 1 tablet (4 mg total) by mouth every 8 (eight) hours as needed for nausea or vomiting. 05/02/20  Yes Tilden Fossa, MD    Allergies    Patient has no known allergies.  Review of Systems   Review of Systems  Respiratory: Positive for  cough.   All other systems reviewed and are negative.   Physical Exam Updated Vital Signs BP 101/64   Pulse 86   Temp 98.8 F (37.1 C) (Oral)   Resp 16   LMP 05/01/2020   SpO2 100%   Physical Exam Vitals and nursing note reviewed.  Constitutional:      Appearance: She is well-developed and well-nourished.  HENT:     Head: Normocephalic and atraumatic.  Cardiovascular:     Rate and Rhythm: Normal rate and regular rhythm.     Heart sounds: No murmur heard.   Pulmonary:     Effort: Pulmonary effort is normal. No respiratory distress.     Breath sounds: Normal breath sounds.  Abdominal:     Palpations: Abdomen is soft.     Tenderness: There is no abdominal tenderness. There is no guarding or rebound.  Musculoskeletal:        General: No swelling, tenderness or edema.  Skin:    General: Skin is warm and dry.  Neurological:     Mental Status: She is alert and oriented to person, place, and time.  Psychiatric:        Mood and Affect: Mood and affect normal.        Behavior: Behavior normal.     ED Results / Procedures /  Treatments   Labs (all labs ordered are listed, but only abnormal results are displayed) Labs Reviewed  SARS CORONAVIRUS 2 (TAT 6-24 HRS)    EKG None  Radiology No results found.  Procedures Procedures (including critical care time)  Medications Ordered in ED Medications  acetaminophen (TYLENOL) tablet 650 mg (650 mg Oral Given 05/02/20 0226)    ED Course  I have reviewed the triage vital signs and the nursing notes.  Pertinent labs & imaging results that were available during my care of the patient were reviewed by me and considered in my medical decision making (see chart for details).    MDM Rules/Calculators/A&P                         patient here for evaluation of cough, nasal congestion and vomiting. Symptoms began on Monday. She is non-toxic appearing on evaluation with no respiratory distress. She was febrile tachycardic on ED  presentation, improved on reassessment. Lungs are clear. Presentation is not consistent with pneumonia, major G.I. bleed, sepsis. Discussed with patient home care for viral URI, possible COVID-19 infection. Discussed outpatient follow-up and return precautions.  Yolanda Tate was evaluated in Emergency Department on 05/02/2020 for the symptoms described in the history of present illness. She was evaluated in the context of the global COVID-19 pandemic, which necessitated consideration that the patient might be at risk for infection with the SARS-CoV-2 virus that causes COVID-19. Institutional protocols and algorithms that pertain to the evaluation of patients at risk for COVID-19 are in a state of rapid change based on information released by regulatory bodies including the CDC and federal and state organizations. These policies and algorithms were followed during the patient's care in the ED.  Final Clinical Impression(s) / ED Diagnoses Final diagnoses:  Suspected COVID-19 virus infection  Viral URI with cough    Rx / DC Orders ED Discharge Orders         Ordered    ondansetron (ZOFRAN) 4 MG tablet  Every 8 hours PRN        05/02/20 0755    benzonatate (TESSALON) 100 MG capsule  Every 8 hours        05/02/20 0755           Tilden Fossa, MD 05/02/20 0800

## 2020-11-26 ENCOUNTER — Encounter (HOSPITAL_BASED_OUTPATIENT_CLINIC_OR_DEPARTMENT_OTHER): Payer: Self-pay | Admitting: Emergency Medicine

## 2020-11-26 ENCOUNTER — Other Ambulatory Visit: Payer: Self-pay

## 2020-11-26 ENCOUNTER — Emergency Department (HOSPITAL_BASED_OUTPATIENT_CLINIC_OR_DEPARTMENT_OTHER)
Admission: EM | Admit: 2020-11-26 | Discharge: 2020-11-26 | Disposition: A | Discharge status: Home / Self Care | Payer: Medicaid Other | Attending: Emergency Medicine | Admitting: Emergency Medicine

## 2020-11-26 ENCOUNTER — Emergency Department (HOSPITAL_BASED_OUTPATIENT_CLINIC_OR_DEPARTMENT_OTHER): Payer: Medicaid Other

## 2020-11-26 DIAGNOSIS — Z2913 Encounter for prophylactic Rho(D) immune globulin: Secondary | ICD-10-CM | POA: Insufficient documentation

## 2020-11-26 DIAGNOSIS — O418X11 Other specified disorders of amniotic fluid and membranes, first trimester, fetus 1: Secondary | ICD-10-CM | POA: Insufficient documentation

## 2020-11-26 DIAGNOSIS — O4690 Antepartum hemorrhage, unspecified, unspecified trimester: Secondary | ICD-10-CM

## 2020-11-26 DIAGNOSIS — Z3A11 11 weeks gestation of pregnancy: Secondary | ICD-10-CM | POA: Diagnosis not present

## 2020-11-26 DIAGNOSIS — O468X1 Other antepartum hemorrhage, first trimester: Secondary | ICD-10-CM

## 2020-11-26 DIAGNOSIS — F17290 Nicotine dependence, other tobacco product, uncomplicated: Secondary | ICD-10-CM | POA: Insufficient documentation

## 2020-11-26 LAB — CBC WITH DIFFERENTIAL/PLATELET
Abs Immature Granulocytes: 0.03 10*3/uL (ref 0.00–0.07)
Basophils Absolute: 0 10*3/uL (ref 0.0–0.1)
Basophils Relative: 0 %
Eosinophils Absolute: 0.1 10*3/uL (ref 0.0–0.5)
Eosinophils Relative: 1 %
HCT: 34.2 % — ABNORMAL LOW (ref 36.0–46.0)
Hemoglobin: 12 g/dL (ref 12.0–15.0)
Immature Granulocytes: 0 %
Lymphocytes Relative: 15 %
Lymphs Abs: 1.1 10*3/uL (ref 0.7–4.0)
MCH: 29.3 pg (ref 26.0–34.0)
MCHC: 35.1 g/dL (ref 30.0–36.0)
MCV: 83.6 fL (ref 80.0–100.0)
Monocytes Absolute: 0.5 10*3/uL (ref 0.1–1.0)
Monocytes Relative: 7 %
Neutro Abs: 5.7 10*3/uL (ref 1.7–7.7)
Neutrophils Relative %: 77 %
Platelets: 272 10*3/uL (ref 150–400)
RBC: 4.09 MIL/uL (ref 3.87–5.11)
RDW: 14.1 % (ref 11.5–15.5)
WBC: 7.4 10*3/uL (ref 4.0–10.5)
nRBC: 0 % (ref 0.0–0.2)

## 2020-11-26 LAB — ABO/RH
ABO/RH(D): B NEG
Antibody Screen: NEGATIVE

## 2020-11-26 MED ORDER — RHO D IMMUNE GLOBULIN 1500 UNIT/2ML IJ SOSY
300.0000 ug | PREFILLED_SYRINGE | Freq: Once | INTRAMUSCULAR | Status: AC
  Administered 2020-11-26: 300 ug via INTRAMUSCULAR
  Filled 2020-11-26: qty 2

## 2020-11-26 NOTE — ED Triage Notes (Signed)
Pt reports dark red vaginal bleeding this morning. Pt is [redacted] weeks pregnant. Denies pain at this time.

## 2020-11-26 NOTE — ED Provider Notes (Signed)
[facility] EMERGENCY DEPARTMENT Provider Note   CSN: [id number] Arrival date & time: 11/26/20  5784     History Chief Complaint  Patient presents with   Vaginal Bleeding    Yolanda Tate is a 25 y.o. female.  G3 [id number] with a history of preterm labor. She is [redacted] weeks pregnant and had a large gush of vaginal bleeding today. She is developing some mild lower abdominal cramping. RH negative.  The history is provided by the patient.  Vaginal Bleeding Quality:  Dark red Severity:  Severe Onset quality:  Sudden Duration:  1 hour Timing:  Constant Progression:  Unchanged Chronicity:  New Possible pregnancy: yes   Context: spontaneously   Relieved by:  Nothing Worsened by:  Nothing Ineffective treatments:  None tried Associated symptoms: abdominal pain (cramping lower abd pain)   Associated symptoms: no back pain, no dysuria and no fever   Risk factors: no hx of ectopic pregnancy       Past Medical History:  Diagnosis Date   Migraine     There are no problems to display for this patient.   Past Surgical History:  Procedure Laterality Date   CESAREAN SECTION       OB History   No obstetric history on file.     No family history on file.  Social History   Tobacco Use   Smoking status: Every Day    Types: Cigars   Smokeless tobacco: Never  Vaping Use   Vaping Use: Never used  Substance Use Topics   Alcohol use: No   Drug use: No    Home Medications Prior to Admission medications   Medication Sig Start Date End Date Taking? Authorizing Provider  benzonatate (TESSALON) 100 MG capsule Take 1 capsule (100 mg total) by mouth every 8 (eight) hours. 05/02/20   Tilden Fossa, MD  ondansetron (ZOFRAN) 4 MG tablet Take 1 tablet (4 mg total) by mouth every 8 (eight) hours as needed for nausea or vomiting. 05/02/20   Tilden Fossa, MD    Allergies    Patient has no known allergies.  Review of Systems   Review of Systems  Constitutional:   Negative for chills and fever.  HENT:  Negative for ear pain and sore throat.   Eyes:  Negative for pain and visual disturbance.  Respiratory:  Negative for cough and shortness of breath.   Cardiovascular:  Negative for chest pain and palpitations.  Gastrointestinal:  Positive for abdominal pain (cramping lower abd pain). Negative for vomiting.  Genitourinary:  Positive for vaginal bleeding. Negative for dysuria and hematuria.  Musculoskeletal:  Negative for arthralgias and back pain.  Skin:  Negative for color change and rash.  Neurological:  Negative for seizures and syncope.  All other systems reviewed and are negative.  Physical Exam Updated Vital Signs BP 110/64 (BP Location: Right Arm)   Pulse 90   Temp 98.6 F (37 C)   Resp 17   Ht 5\' 5"  (1.651 m)   Wt 68 kg   LMP 05/01/2020   SpO2 100%   BMI 24.96 kg/m   Physical Exam Vitals and nursing note reviewed. Exam conducted with a chaperone present.  HENT:     Head: Normocephalic and atraumatic.  Eyes:     General: No scleral icterus. Pulmonary:     Effort: Pulmonary effort is normal. No respiratory distress.  Abdominal:     General: There is no distension.     Palpations: Abdomen is soft.  Tenderness: There is no abdominal tenderness.  Genitourinary:    Vagina: Bleeding present.     Comments: I removed a large blood clot from the vaginal vault. The cervix is closed.  Musculoskeletal:     Cervical back: Normal range of motion.  Skin:    General: Skin is warm and dry.  Neurological:     Mental Status: She is alert.  Psychiatric:        Mood and Affect: Mood normal.    ED Results / Procedures / Treatments   Labs (all labs ordered are listed, but only abnormal results are displayed) Labs Reviewed  CBC WITH DIFFERENTIAL/PLATELET - Abnormal; Notable for the following components:      Result Value   HCT 34.2 (*)    All other components within normal limits  ABO/RH    EKG None  Radiology US OB  Limited  Result Date: 11/26/2020 CLINICAL DATA:  Eleven weeks pregnant with vaginal bleeding. EXAM: OBSTETRIC <14 WK [place] AND TRANSVAGINAL OB US TECHNIQUE: Both transabdominal and transvaginal ultrasound examinations were performed for complete evaluation of the gestation as well as the maternal uterus, adnexal regions, and pelvic cul-de-sac. Transvaginal technique was performed to assess early pregnancy. COMPARISON:  Prior outside ultrasound examination 11/24/2020 FINDINGS: Intrauterine gestational sac: Present Yolk sac:  None Embryo:  Present Cardiac Activity: Present Heart Rate: 157 bpm CRL:  48 mm   11 w   4 d                  [place] EDC: 06/13/2021 Subchorionic hemorrhage: Moderate to large focus of subchorionic hemorrhage. The larger focus is noted laterally and has a maximum diameter of 2.5 cm. There is also a smaller focus of hemorrhage inferiorly. Maternal uterus/adnexae: The left ovary is normal. The right ovary is not identified. No free pelvic fluid collections. IMPRESSION: 1. Single living intrauterine fetus estimated at 11 weeks and 4 days gestation. 2. Moderate to large subchorionic hemorrhage. 3. Normal left ovary.  Right ovary not visualized. Electronically Signed   By: Rudie Meyer M.D.   On: 11/26/2020 11:10    Procedures Procedures   Medications Ordered in ED Medications - No data to display  ED Course  I have reviewed the triage vital signs and the nursing notes.  Pertinent labs & imaging results that were available during my care of the patient were reviewed by me and considered in my medical decision making (see chart for details).    MDM Rules/Calculators/A&P                           Yolanda Tate presents with vaginal bleeding at [redacted] weeks gestation.  She is hemodynamically stable with normal hemoglobin.  She has known Rh- blood and will be given RhoGAM.  She was counseled to avoid strenuous activity, engage in pelvic rest, and follow closely with her OB/GYN.  She was  given return precautions. Final Clinical Impression(s) / ED Diagnoses Final diagnoses:  Subchorionic hematoma in first trimester, single or unspecified fetus    Rx / DC Orders ED Discharge Orders     None        Koleen Distance, MD 11/26/20 1155

## 2021-03-09 ENCOUNTER — Emergency Department (HOSPITAL_BASED_OUTPATIENT_CLINIC_OR_DEPARTMENT_OTHER): Payer: Medicaid Other

## 2021-03-09 ENCOUNTER — Encounter (HOSPITAL_BASED_OUTPATIENT_CLINIC_OR_DEPARTMENT_OTHER): Payer: Self-pay

## 2021-03-09 ENCOUNTER — Emergency Department (HOSPITAL_BASED_OUTPATIENT_CLINIC_OR_DEPARTMENT_OTHER)
Admission: EM | Admit: 2021-03-09 | Discharge: 2021-03-09 | Disposition: A | Discharge status: Home / Self Care | Payer: Medicaid Other | Attending: Emergency Medicine | Admitting: Emergency Medicine

## 2021-03-09 ENCOUNTER — Telehealth (HOSPITAL_BASED_OUTPATIENT_CLINIC_OR_DEPARTMENT_OTHER): Payer: Self-pay | Admitting: Emergency Medicine

## 2021-03-09 DIAGNOSIS — U071 COVID-19: Secondary | ICD-10-CM | POA: Insufficient documentation

## 2021-03-09 DIAGNOSIS — R000 Tachycardia, unspecified: Secondary | ICD-10-CM | POA: Diagnosis not present

## 2021-03-09 DIAGNOSIS — O98512 Other viral diseases complicating pregnancy, second trimester: Secondary | ICD-10-CM | POA: Insufficient documentation

## 2021-03-09 DIAGNOSIS — Z3492 Encounter for supervision of normal pregnancy, unspecified, second trimester: Secondary | ICD-10-CM

## 2021-03-09 LAB — CBC WITH DIFFERENTIAL/PLATELET
Abs Immature Granulocytes: 0.07 10*3/uL (ref 0.00–0.07)
Basophils Absolute: 0 10*3/uL (ref 0.0–0.1)
Basophils Relative: 0 %
Eosinophils Absolute: 0.1 10*3/uL (ref 0.0–0.5)
Eosinophils Relative: 1 %
HCT: 30.7 % — ABNORMAL LOW (ref 36.0–46.0)
Hemoglobin: 10.3 g/dL — ABNORMAL LOW (ref 12.0–15.0)
Immature Granulocytes: 1 %
Lymphocytes Relative: 5 %
Lymphs Abs: 0.5 10*3/uL — ABNORMAL LOW (ref 0.7–4.0)
MCH: 27.1 pg (ref 26.0–34.0)
MCHC: 33.6 g/dL (ref 30.0–36.0)
MCV: 80.8 fL (ref 80.0–100.0)
Monocytes Absolute: 0.8 10*3/uL (ref 0.1–1.0)
Monocytes Relative: 8 %
Neutro Abs: 7.9 10*3/uL — ABNORMAL HIGH (ref 1.7–7.7)
Neutrophils Relative %: 85 %
Platelets: 302 10*3/uL (ref 150–400)
RBC: 3.8 MIL/uL — ABNORMAL LOW (ref 3.87–5.11)
RDW: 14.3 % (ref 11.5–15.5)
WBC: 9.4 10*3/uL (ref 4.0–10.5)
nRBC: 0 % (ref 0.0–0.2)

## 2021-03-09 LAB — COMPREHENSIVE METABOLIC PANEL
ALT: 12 U/L (ref 0–44)
AST: 19 U/L (ref 15–41)
Albumin: 3.5 g/dL (ref 3.5–5.0)
Alkaline Phosphatase: 43 U/L (ref 38–126)
Anion gap: 9 (ref 5–15)
BUN: 5 mg/dL — ABNORMAL LOW (ref 6–20)
CO2: 20 mmol/L — ABNORMAL LOW (ref 22–32)
Calcium: 9.4 mg/dL (ref 8.9–10.3)
Chloride: 104 mmol/L (ref 98–111)
Creatinine, Ser: 0.63 mg/dL (ref 0.44–1.00)
GFR, Estimated: >60 mL/min (ref >60)
Glucose, Bld: 80 mg/dL (ref 70–99)
Potassium: 3.8 mmol/L (ref 3.5–5.1)
Sodium: 133 mmol/L — ABNORMAL LOW (ref 135–145)
Total Bilirubin: 0.5 mg/dL (ref 0.3–1.2)
Total Protein: 6.7 g/dL (ref 6.5–8.1)

## 2021-03-09 LAB — RESP PANEL BY RT-PCR (FLU A&B, COVID) ARPGX2
Influenza A by PCR: NEGATIVE
Influenza B by PCR: NEGATIVE
SARS Coronavirus 2 by RT PCR: POSITIVE — AB

## 2021-03-09 MED ORDER — ACETAMINOPHEN 325 MG PO TABS
650.0000 mg | ORAL_TABLET | Freq: Once | ORAL | Status: AC
  Administered 2021-03-09: 650 mg via ORAL
  Filled 2021-03-09: qty 2

## 2021-03-09 MED ORDER — IOHEXOL 350 MG/ML SOLN
100.0000 mL | Freq: Once | INTRAVENOUS | Status: AC | PRN
  Administered 2021-03-09: 75 mL via INTRAVENOUS

## 2021-03-09 MED ORDER — LACTATED RINGERS IV BOLUS
1000.0000 mL | Freq: Once | INTRAVENOUS | Status: AC
  Administered 2021-03-09: 1000 mL via INTRAVENOUS

## 2021-03-09 MED ORDER — ALBUTEROL SULFATE HFA 108 (90 BASE) MCG/ACT IN AERS: 1.0000 | INHALATION_SPRAY | Freq: Four times a day (QID) | RESPIRATORY_TRACT | 0 refills | Status: AC | PRN

## 2021-03-09 NOTE — Progress Notes (Signed)
Spoke with Gordy Councilman and told her that Dr. Crissie Reese wants the pt transferred here to further assess the fetal heart rate. Says he will call the [facility] MD and talk with him.

## 2021-03-09 NOTE — Progress Notes (Signed)
Dr. Crissie Reese called me and said that the pt could be managed there and they would try to get the baby on the monitor.

## 2021-03-09 NOTE — ED Notes (Signed)
Called and spoke with [facility] with OB rapid response. Awaiting call back.

## 2021-03-09 NOTE — ED Notes (Signed)
FHR 162 Rapid OB aware

## 2021-03-09 NOTE — Progress Notes (Signed)
Spoke with Asher Muir. FHR tracing is tracing maternal The FHR is tracing along with the pulse ox. Maternal heart rate is 137-140. I asked her to adjust the monitor to trace the fetal heart rate She says the fetal heartrate is 158 with much fetal movement and the maternal heart rate is 130. She says she is unable to continously trace the fetal heart rate. Monitor still showing maternal heart rate.

## 2021-03-09 NOTE — Progress Notes (Signed)
Dr. Crissie Reese on unit. Reviewed tracing.

## 2021-03-09 NOTE — ED Notes (Signed)
Patient transported to CT 

## 2021-03-09 NOTE — Progress Notes (Signed)
Spoke with Yolanda Tate. She says she will intermittently doppler the FHR and document it in the chart.

## 2021-03-09 NOTE — Telephone Encounter (Signed)
Patient request albuterol as needed. Prescribed to her pharmacy

## 2021-03-09 NOTE — Progress Notes (Signed)
Spoke with Thayer Ohm, pt's new nurse. Pt is up to the bathroom. Says he will put the contraction monitor back on the pt and continue to doppler the FHR every 20-44min.

## 2021-03-09 NOTE — Progress Notes (Signed)
Received call from [facility] RN, Gordy Councilman.Pt is a G2P1 at [redacted] weeks gestation presenting with c/o shortness of breath and vomiting. She has tested positive for covid today. Her first baby was delivered by C/S at 33 weeks. No vaginal bleeding or leaking of fluid. She gets [facility].

## 2021-03-09 NOTE — Progress Notes (Signed)
Spoke with Dr. Crissie Reese. Pt is a G2P1 at [redacted] weeks gestation presenting to [facility] with shortness of breath and vomiting. She tested positive for covid today.Her temp is 100. Maternal heart rate is 137-140. Monitor is tracing maternal. Fetal heart rate was obtained briefly at 158 BPM. Asher Muir says she cannot continously trace the baby. Dr. Crissie Reese wants the pt transferred here to [facility] if they cannot trace the fetal heart rate.

## 2021-03-09 NOTE — Progress Notes (Signed)
Spoke with Research scientist (life sciences). Pt is ready for d/c. No c/o abd cramping.

## 2021-03-09 NOTE — ED Provider Notes (Signed)
  Physical Exam  BP 121/67 (BP Location: Right Arm)   Pulse (!) 120   Temp (S) 99.3 F (37.4 C) (Oral)   Resp (!) 26   Ht 5\' 5"  (1.651 m)   Wt 76.2 kg   LMP 05/01/2020   SpO2 100%   BMI 27.96 kg/m   Physical Exam  ED Course/Procedures     Procedures  MDM  Patient is positive for COVID.  Patient is also about [redacted] weeks pregnant.  Signout pending CTA chest and reassessment  4:13 PM Heart rate down to the 110s now.  Patient ambulatory and went to the bathroom and felt better.  Patient has no vaginal bleeding or discharge.  OB nurse had difficulty getting fetal heart tones but the heart tones obtained were normal.  I discussed with Dr. 05/03/2020 from [facility].  He states that since patient has no OB complaints currently, patient can be discharged and follow-up with her [facility] doctor outpatient.  Patient follows up at [facility].  If baby is not moving or she has any OB complaints, she needs to go to the ER or see her OB doctor.  I again offered to give her Paxlovid and she refused        [facility], MD 03/09/21 1615

## 2021-03-09 NOTE — ED Notes (Signed)
Spoke with Yolanda Tate with OB rapid response who state OB doctor wants pt transferred to [facility] for monitoring of fetal HR. Having difficulty obtaining strip of heart rate d/t baby moving.

## 2021-03-09 NOTE — Discharge Instructions (Addendum)
You have COVID and will need to isolate for 5 days per guidelines  Stay hydrated.  Please monitor fetal movements.  If you do not feel your baby moving, you need to come to the ED or go to your [facility] doctor.  Please call your OB doctor for appointment this week.  Return to ER if you have trouble breathing, worse cough, shortness of breath, abdominal pain, vaginal bleeding or discharge     Person Under Monitoring Name: Yolanda Tate  Location: [address] [place] [address]   Infection Prevention Recommendations for Individuals Confirmed to have, or Being Evaluated for, 2019 Novel Coronavirus (COVID-19) Infection Who Receive Care at Home  Individuals who are confirmed to have, or are being evaluated for, COVID-19 should follow the prevention steps below until a healthcare provider or local or state health department says they can return to normal activities.  Stay home except to get medical care You should restrict activities outside your home, except for getting medical care. Do not go to work, school, or public areas, and do not use public transportation or taxis.  Call ahead before visiting your doctor Before your medical appointment, call the healthcare provider and tell them that you have, or are being evaluated for, COVID-19 infection. This will help the healthcare provider's office take steps to keep other people from getting infected. Ask your healthcare provider to call the local or state health department.  Monitor your symptoms Seek prompt medical attention if your illness is worsening (e.g., difficulty breathing). Before going to your medical appointment, call the healthcare provider and tell them that you have, or are being evaluated for, COVID-19 infection. Ask your healthcare provider to call the local or state health department.  Wear a facemask You should wear a facemask that covers your nose and mouth when you are in the same room with other people  and when you visit a healthcare provider. People who live with or visit you should also wear a facemask while they are in the same room with you.  Separate yourself from other people in your home As much as possible, you should stay in a different room from other people in your home. Also, you should use a separate bathroom, if available.  Avoid sharing household items You should not share dishes, drinking glasses, cups, eating utensils, towels, bedding, or other items with other people in your home. After using these items, you should wash them thoroughly with soap and water.  Cover your coughs and sneezes Cover your mouth and nose with a tissue when you cough or sneeze, or you can cough or sneeze into your sleeve. Throw used tissues in a lined trash can, and immediately wash your hands with soap and water for at least 20 seconds or use an alcohol-based hand rub.  Wash your [employer] your hands often and thoroughly with soap and water for at least 20 seconds. You can use an alcohol-based hand sanitizer if soap and water are not available and if your hands are not visibly dirty. Avoid touching your eyes, nose, and mouth with unwashed hands.   Prevention Steps for Caregivers and Household Members of Individuals Confirmed to have, or Being Evaluated for, COVID-19 Infection Being Cared for in the Home  If you live with, or provide care at home for, a person confirmed to have, or being evaluated for, COVID-19 infection please follow these guidelines to prevent infection:  Follow healthcare provider's instructions Make sure that you understand and can help the  patient follow any healthcare provider instructions for all care.  Provide for the patient's basic needs You should help the patient with basic needs in the home and provide support for getting groceries, prescriptions, and other personal needs.  Monitor the patient's symptoms If they are getting sicker, call his or her medical  provider and tell them that the patient has, or is being evaluated for, COVID-19 infection. This will help the healthcare provider's office take steps to keep other people from getting infected. Ask the healthcare provider to call the local or state health department.  Limit the number of people who have contact with the patient If possible, have only one caregiver for the patient. Other household members should stay in another home or place of residence. If this is not possible, they should stay in another room, or be separated from the patient as much as possible. Use a separate bathroom, if available. Restrict visitors who do not have an essential need to be in the home.  Keep older adults, very young children, and other sick people away from the patient Keep older adults, very young children, and those who have compromised immune systems or chronic health conditions away from the patient. This includes people with chronic heart, lung, or kidney conditions, diabetes, and cancer.  Ensure good ventilation Make sure that shared spaces in the home have good air flow, such as from an air conditioner or an opened window, weather permitting.  Wash your hands often Wash your hands often and thoroughly with soap and water for at least 20 seconds. You can use an alcohol based hand sanitizer if soap and water are not available and if your hands are not visibly dirty. Avoid touching your eyes, nose, and mouth with unwashed hands. Use disposable paper towels to dry your hands. If not available, use dedicated cloth towels and replace them when they become wet.  Wear a facemask and gloves Wear a disposable facemask at all times in the room and gloves when you touch or have contact with the patient's blood, body fluids, and/or secretions or excretions, such as sweat, saliva, sputum, nasal mucus, vomit, urine, or feces.  Ensure the mask fits over your nose and mouth tightly, and do not touch it during  use. Throw out disposable facemasks and gloves after using them. Do not reuse. Wash your hands immediately after removing your facemask and gloves. If your personal clothing becomes contaminated, carefully remove clothing and launder. Wash your hands after handling contaminated clothing. Place all used disposable facemasks, gloves, and other waste in a lined container before disposing them with other household waste. Remove gloves and wash your hands immediately after handling these items.  Do not share dishes, glasses, or other household items with the patient Avoid sharing household items. You should not share dishes, drinking glasses, cups, eating utensils, towels, bedding, or other items with a patient who is confirmed to have, or being evaluated for, COVID-19 infection. After the person uses these items, you should wash them thoroughly with soap and water.  Wash laundry thoroughly Immediately remove and wash clothes or bedding that have blood, body fluids, and/or secretions or excretions, such as sweat, saliva, sputum, nasal mucus, vomit, urine, or feces, on them. Wear gloves when handling laundry from the patient. Read and follow directions on labels of laundry or clothing items and detergent. In general, wash and dry with the warmest temperatures recommended on the label.  Clean all areas the individual has used often Clean all touchable surfaces, such  as counters, tabletops, doorknobs, bathroom fixtures, toilets, phones, keyboards, tablets, and bedside tables, every day. Also, clean any surfaces that may have blood, body fluids, and/or secretions or excretions on them. Wear gloves when cleaning surfaces the patient has come in contact with. Use a diluted bleach solution (e.g., dilute bleach with 1 part bleach and 10 parts water) or a household disinfectant with a label that says EPA-registered for coronaviruses. To make a bleach solution at home, add 1 tablespoon of bleach to 1 quart (4  cups) of water. For a larger supply, add  cup of bleach to 1 gallon (16 cups) of water. Read labels of cleaning products and follow recommendations provided on product labels. Labels contain instructions for safe and effective use of the cleaning product including precautions you should take when applying the product, such as wearing gloves or eye protection and making sure you have good ventilation during use of the product. Remove gloves and wash hands immediately after cleaning.  Monitor yourself for signs and symptoms of illness Caregivers and household members are considered close contacts, should monitor their health, and will be asked to limit movement outside of the home to the extent possible. Follow the monitoring steps for close contacts listed on the symptom monitoring form.   ? If you have additional questions, contact your local health department or call the epidemiologist on call at [phone] (available 24/7). ? This guidance is subject to change. For the most up-to-date guidance from [facility], please refer to their website: [email or web address]

## 2021-03-09 NOTE — ED Provider Notes (Signed)
[facility] EMERGENCY DEPARTMENT Provider Note   CSN: [id number] Arrival date & time: 03/09/21  1116     History Chief Complaint  Patient presents with   Covid Positive    Pregnant    Yolanda Tate is a 25 y.o. female.  HPI 25 year old female G3P1 @~26 weeks presents after finding out she is COVID-positive.  She has been feeling poorly since 11/5 evening.  She is having cough, shortness of breath, and rib pain from coughing.  She has had some vomiting that started this morning.  She took 2 home COVID test that were both positive.  She has not received any vaccinations.  She denies any abdominal pain, abnormal fetal movements, or vaginal bleeding.  Past Medical History:  Diagnosis Date   Migraine     There are no problems to display for this patient.   Past Surgical History:  Procedure Laterality Date   CESAREAN SECTION       OB History     Gravida  1   Para      Term      Preterm      AB      Living         SAB      IAB      Ectopic      Multiple      Live Births              History reviewed. No pertinent family history.  Social History   Tobacco Use   Smoking status: Every Day    Types: Cigars   Smokeless tobacco: Never  Vaping Use   Vaping Use: Never used  Substance Use Topics   Alcohol use: No   Drug use: No    Home Medications Prior to Admission medications   Medication Sig Start Date End Date Taking? Authorizing Provider  benzonatate (TESSALON) 100 MG capsule Take 1 capsule (100 mg total) by mouth every 8 (eight) hours. 05/02/20   Tilden Fossa, MD  ondansetron (ZOFRAN) 4 MG tablet Take 1 tablet (4 mg total) by mouth every 8 (eight) hours as needed for nausea or vomiting. 05/02/20   Tilden Fossa, MD    Allergies    Patient has no known allergies.  Review of Systems   Review of Systems  Constitutional:  Positive for fever.  Respiratory:  Positive for cough and shortness of breath.   Cardiovascular:   Positive for chest pain.  Gastrointestinal:  Positive for vomiting. Negative for abdominal pain.  Genitourinary:  Negative for vaginal bleeding.  All other systems reviewed and are negative.  Physical Exam Updated Vital Signs BP 111/70 (BP Location: Right Arm)   Pulse (!) 117   Temp (S) 99.3 F (37.4 C) (Oral)   Resp (!) 26   Ht 5\' 5"  (1.651 m)   Wt 76.2 kg   LMP 05/01/2020   SpO2 100%   BMI 27.96 kg/m   Physical Exam Vitals and nursing note reviewed.  Constitutional:      General: She is not in acute distress.    Appearance: She is well-developed. She is not ill-appearing or diaphoretic.  HENT:     Head: Normocephalic and atraumatic.     Right Ear: External ear normal.     Left Ear: External ear normal.     Nose: Nose normal.  Eyes:     General:        Right eye: No discharge.        Left  eye: No discharge.  Cardiovascular:     Rate and Rhythm: Regular rhythm. Tachycardia present.     Heart sounds: Normal heart sounds.  Pulmonary:     Effort: Pulmonary effort is normal.     Breath sounds: Normal breath sounds.  Abdominal:     Palpations: Abdomen is soft.     Tenderness: There is no abdominal tenderness.  Skin:    General: Skin is warm and dry.  Neurological:     Mental Status: She is alert.  Psychiatric:        Mood and Affect: Mood is not anxious.    ED Results / Procedures / Treatments   Labs (all labs ordered are listed, but only abnormal results are displayed) Labs Reviewed  COMPREHENSIVE METABOLIC PANEL - Abnormal; Notable for the following components:      Result Value   Sodium 133 (*)    CO2 20 (*)    BUN 5 (*)    All other components within normal limits  CBC WITH DIFFERENTIAL/PLATELET - Abnormal; Notable for the following components:   RBC 3.80 (*)    Hemoglobin 10.3 (*)    HCT 30.7 (*)    Neutro Abs 7.9 (*)    Lymphs Abs 0.5 (*)    All other components within normal limits  RESP PANEL BY RT-PCR (FLU A&B, COVID) [id number]    EKG EKG  Interpretation  Date/Time:  Monday March 09 2021 11:28:21 EST Ventricular Rate:  145 PR Interval:  126 QRS Duration: 64 QT Interval:  268 QTC Calculation: 416 R Axis:   -3 Text Interpretation: Sinus tachycardia Otherwise normal ECG No old tracing to compare Confirmed by Pricilla Loveless [phone]) on 03/09/2021 11:33:24 AM  Radiology No results found.  Procedures Ultrasound ED Peripheral IV (Provider)  Date/Time: 03/09/2021 3:05 PM Performed by: Pricilla Loveless, MD Authorized by: Pricilla Loveless, MD   Procedure details:    Indications: multiple failed IV attempts and poor IV access     Skin Prep: chlorhexidine gluconate     Location:  Right AC   Angiocath:  20 G   Bedside Ultrasound Guided: Yes     Patient tolerated procedure without complications: Yes     Dressing applied: Yes     Medications Ordered in ED Medications  acetaminophen (TYLENOL) tablet 650 mg (650 mg Oral Given 03/09/21 1208)  lactated ringers bolus 1,000 mL (0 mLs Intravenous Stopped 03/09/21 1305)  lactated ringers bolus 1,000 mL (0 mLs Intravenous Stopped 03/09/21 1415)    ED Course  I have reviewed the triage vital signs and the nursing notes.  Pertinent labs & imaging results that were available during my care of the patient were reviewed by me and considered in my medical decision making (see chart for details).    MDM Rules/Calculators/A&P                          Patient's COVID test is positive.  She is quite tachycardic and tachypneic on arrival though this is improving and she is symptomatically feeling better with Tylenol and fluids.  Fever has finally seem to improve.  I did briefly discussed with Dr. Crissie Reese of OB/GYN given we are having some difficulty with the tocometer and getting baby's heart rate.  However on spot Dopplers there is good heart rates every time but hard to keep it on baby continuously.  He does recommend a CTA if not improving to have low threshold for PE.  This CT is  currently  pending.  Care to Dr. Silverio Lay. Final Clinical Impression(s) / ED Diagnoses Final diagnoses:  None    Rx / DC Orders ED Discharge Orders     None        Pricilla Loveless, MD 03/09/21 1516

## 2021-03-09 NOTE — Progress Notes (Signed)
Spoke with Dr. Crissie Reese. He says that it is fine that they doppler the Fetal heart rate intermittently with the doppler.

## 2021-03-09 NOTE — Progress Notes (Signed)
Spoke with Lestine Mount. She says that she is able to doppler the Fetal heart rate at 158, 162, and 168 BPM. She put her phone on speaker and I could hear the FHR.

## 2021-03-09 NOTE — ED Triage Notes (Signed)
Pt tested positive for covid this morning. C/o SOB and vomiting today. [redacted] weeks pregnant, denies abdominal pain/vaginal discharge.

## 2021-03-09 NOTE — ED Notes (Signed)
FHR 158 [facility] made aware, unable to pick up on toco, [facility] ask to be made aware every 20-30 min of FHR, will document in Epic

## 2021-03-09 NOTE — ED Notes (Signed)
[facility] reports no need to transfer at this time per OB and ED MD, all team members aware of continual documentation of FHR with small doppler.

## 2021-04-30 ENCOUNTER — Encounter (HOSPITAL_BASED_OUTPATIENT_CLINIC_OR_DEPARTMENT_OTHER): Payer: Self-pay | Admitting: *Deleted

## 2021-04-30 ENCOUNTER — Other Ambulatory Visit: Payer: Self-pay

## 2021-04-30 DIAGNOSIS — J101 Influenza due to other identified influenza virus with other respiratory manifestations: Secondary | ICD-10-CM | POA: Diagnosis not present

## 2021-04-30 DIAGNOSIS — Z87891 Personal history of nicotine dependence: Secondary | ICD-10-CM | POA: Insufficient documentation

## 2021-04-30 DIAGNOSIS — O98513 Other viral diseases complicating pregnancy, third trimester: Secondary | ICD-10-CM | POA: Diagnosis not present

## 2021-04-30 DIAGNOSIS — O26893 Other specified pregnancy related conditions, third trimester: Secondary | ICD-10-CM | POA: Diagnosis not present

## 2021-04-30 DIAGNOSIS — Z20822 Contact with and (suspected) exposure to covid-19: Secondary | ICD-10-CM | POA: Diagnosis not present

## 2021-04-30 DIAGNOSIS — Z3A33 33 weeks gestation of pregnancy: Secondary | ICD-10-CM | POA: Diagnosis not present

## 2021-04-30 NOTE — ED Triage Notes (Addendum)
Exposure to Fflu with sx x 3 days, fever , chills , body aches and cough ,PTA tylenol x 1 hr ago , pt is 33 weeks preg

## 2021-05-01 ENCOUNTER — Emergency Department (HOSPITAL_BASED_OUTPATIENT_CLINIC_OR_DEPARTMENT_OTHER)
Admission: EM | Admit: 2021-05-01 | Discharge: 2021-05-01 | Disposition: A | Discharge status: Home / Self Care | Payer: Medicaid Other | Attending: Emergency Medicine | Admitting: Emergency Medicine

## 2021-05-01 DIAGNOSIS — J101 Influenza due to other identified influenza virus with other respiratory manifestations: Secondary | ICD-10-CM

## 2021-05-01 LAB — RESP PANEL BY RT-PCR (FLU A&B, COVID) ARPGX2
Influenza A by PCR: POSITIVE — AB
Influenza B by PCR: NEGATIVE
SARS Coronavirus 2 by RT PCR: NEGATIVE

## 2021-05-01 NOTE — ED Provider Notes (Signed)
[facility] Provider Note: Lowella Dell, MD, FACEP  CSN: [id number] MRN: [id number] ARRIVAL: 04/30/21 at 2310 ROOM: [facility]06/[facility]06   CHIEF COMPLAINT  Exposure to flu   HISTORY OF PRESENT ILLNESS  05/01/21 1:45 AM Yolanda Tate is a 25 y.o. female who is [redacted] weeks pregnant.  She was exposed to flu recently.  She is here with 3 days of flulike symptoms.  Specifically she has had fever, chills, body aches and cough.  She has also had sweats and chills and mild sore throat.  She has taken Tylenol to treat the fever.  Her fever has been as high as 101.8.  She feels she is having Braxton Hicks contractions.  She has had them previously and states these feel the same.  She has not had vaginal bleeding or fluid.  Past Medical History:  Diagnosis Date   Migraine     Past Surgical History:  Procedure Laterality Date   CESAREAN SECTION      No family history on file.  Social History   Tobacco Use   Smoking status: Former    Types: Cigars   Smokeless tobacco: Never  Vaping Use   Vaping Use: Never used  Substance Use Topics   Alcohol use: No   Drug use: No    Prior to Admission medications   Medication Sig Start Date End Date Taking? Authorizing Provider  albuterol (VENTOLIN HFA) 108 (90 Base) MCG/ACT inhaler Inhale 1-2 puffs into the lungs every 6 (six) hours as needed for wheezing or shortness of breath. 03/09/21   Charlynne Pander, MD    Allergies Patient has no known allergies.   REVIEW OF SYSTEMS  Negative except as noted here or in the History of Present Illness.   PHYSICAL EXAMINATION  Initial Vital Signs Blood pressure 122/72, pulse (!) 121, temperature 99.7 F (37.6 C), temperature source Oral, resp. rate 16, height 5\' 5"  (1.651 m), weight 78.5 kg, last menstrual period 08/07/2019, SpO2 98 %.  Examination General: Well-developed, well-nourished female in no acute distress; appearance consistent with age of record HENT: normocephalic;  atraumatic; no pharyngeal erythema or exudate Eyes: Normal appearance Neck: supple Heart: regular rate and rhythm Lungs: clear to auscultation bilaterally Abdomen: soft; gravid, consistent with dates; nontender; bowel sounds present Extremities: No deformity; full range of motion Neurologic: Awake, alert and oriented; motor function intact in all extremities and symmetric; no facial droop Skin: Warm and dry Psychiatric: Normal mood and affect   RESULTS  Summary of this visit's results, reviewed and interpreted by myself:   EKG Interpretation  Date/Time:    Ventricular Rate:    PR Interval:    QRS Duration:   QT Interval:    QTC Calculation:   R Axis:     Text Interpretation:         Laboratory Studies: Results for orders placed or performed during the hospital encounter of 05/01/21 (from the past 24 hour(s))  Resp Panel by RT-PCR (Flu A&B, Covid) Nasopharyngeal Swab     Status: Abnormal   Collection Time: 04/30/21 11:28 PM   Specimen: Nasopharyngeal Swab; Nasopharyngeal(NP) swabs in vial transport medium  Result Value Ref Range   SARS Coronavirus 2 by RT PCR NEGATIVE NEGATIVE   Influenza A by PCR POSITIVE (A) NEGATIVE   Influenza B by PCR NEGATIVE NEGATIVE   Imaging Studies: No results found.  ED COURSE and MDM  Nursing notes, initial and subsequent vitals signs, including pulse oximetry, reviewed and interpreted by myself.  Vitals:  04/30/21 2322 04/30/21 2323  BP: 122/72   Pulse: (!) 121   Resp: 16   Temp: 99.7 F (37.6 C)   TempSrc: Oral   SpO2: 98%   Weight:  78.5 kg  Height:  5\' 5"  (1.651 m)   Medications - No data to display  Patient positive for influenza.  She is outside the window for Tamiflu.  PROCEDURES  Procedures   ED DIAGNOSES     ICD-10-CM   1. Influenza A  J10.1          Gershom Brobeck, MD 05/01/21 [phone]

## 2021-05-05 ENCOUNTER — Other Ambulatory Visit: Payer: Self-pay

## 2021-05-05 ENCOUNTER — Encounter (HOSPITAL_BASED_OUTPATIENT_CLINIC_OR_DEPARTMENT_OTHER): Payer: Self-pay | Admitting: *Deleted

## 2021-05-05 ENCOUNTER — Emergency Department (HOSPITAL_BASED_OUTPATIENT_CLINIC_OR_DEPARTMENT_OTHER)
Admission: EM | Admit: 2021-05-05 | Discharge: 2021-05-06 | Disposition: A | Discharge status: Home / Self Care | Payer: Medicaid Other | Attending: Emergency Medicine | Admitting: Emergency Medicine

## 2021-05-05 DIAGNOSIS — J101 Influenza due to other identified influenza virus with other respiratory manifestations: Secondary | ICD-10-CM | POA: Insufficient documentation

## 2021-05-05 DIAGNOSIS — O09893 Supervision of other high risk pregnancies, third trimester: Secondary | ICD-10-CM | POA: Insufficient documentation

## 2021-05-05 DIAGNOSIS — H9202 Otalgia, left ear: Secondary | ICD-10-CM | POA: Insufficient documentation

## 2021-05-05 DIAGNOSIS — Z3A00 Weeks of gestation of pregnancy not specified: Secondary | ICD-10-CM | POA: Diagnosis not present

## 2021-05-05 DIAGNOSIS — O98513 Other viral diseases complicating pregnancy, third trimester: Secondary | ICD-10-CM | POA: Diagnosis not present

## 2021-05-05 DIAGNOSIS — J0100 Acute maxillary sinusitis, unspecified: Secondary | ICD-10-CM | POA: Insufficient documentation

## 2021-05-05 NOTE — ED Triage Notes (Signed)
C/o left ear pain x 1 day , dx flu x 3 days ago

## 2021-05-06 MED ORDER — SALINE SPRAY 0.65 % NA SOLN: 1.0000 | NASAL | 0 refills | Status: AC | PRN

## 2021-05-06 MED ORDER — LORATADINE 10 MG PO TABS: 10.0000 mg | ORAL_TABLET | Freq: Every day | ORAL | 0 refills | Status: AC

## 2021-05-06 MED ORDER — FLUTICASONE PROPIONATE 50 MCG/ACT NA SUSP: 2.0000 | Freq: Every day | NASAL | 0 refills | Status: AC

## 2021-05-06 NOTE — ED Provider Notes (Signed)
[facility] EMERGENCY DEPARTMENT Provider Note   CSN: [id number] Arrival date & time: 05/05/21  2307     History  Chief Complaint  Patient presents with   Otalgia    Yolanda Tate is a 26 y.o. female.  Patient presents to the emergency department for evaluation of left ear pain and left sinus pain.  Patient was diagnosed with the flu 05/01/21.  She has had fevers and body aches which have improved.  She continues to have a sore throat.  Yesterday she developed pain in her left face and left ear.  She has had significant nasal congestion.  Patient is in her third trimester pregnancy.  She has been taking Tylenol only for her symptoms.  No vomiting or diarrhea.  The onset of this condition was acute. The course is constant. Aggravating factors: none. Alleviating factors: none.        Home Medications Prior to Admission medications   Medication Sig Start Date End Date Taking? Authorizing Provider  fluticasone (FLONASE) 50 MCG/ACT nasal spray Place 2 sprays into both nostrils daily. 05/06/21  Yes Renne Crigler, PA-C  loratadine (CLARITIN) 10 MG tablet Take 1 tablet (10 mg total) by mouth daily. 05/06/21  Yes Renne Crigler, PA-C  sodium chloride (OCEAN) 0.65 % SOLN nasal spray Place 1 spray into both nostrils as needed for congestion. 05/06/21  Yes Renne Crigler, PA-C  albuterol (VENTOLIN HFA) 108 (90 Base) MCG/ACT inhaler Inhale 1-2 puffs into the lungs every 6 (six) hours as needed for wheezing or shortness of breath. 03/09/21   Charlynne Pander, MD      Allergies    Patient has no known allergies.    Review of Systems   Review of Systems  Constitutional:  Positive for fatigue. Negative for chills and fever.  HENT:  Positive for congestion, ear pain, rhinorrhea, sinus pressure and sore throat. Negative for facial swelling.   Eyes:  Negative for redness.  Respiratory:  Positive for cough (Improving). Negative for wheezing.   Gastrointestinal:  Negative for abdominal  pain, diarrhea, nausea and vomiting.  Genitourinary:  Negative for dysuria.  Musculoskeletal:  Negative for myalgias and neck stiffness.  Skin:  Negative for rash.  Neurological:  Negative for headaches.  Hematological:  Negative for adenopathy.   Physical Exam Updated Vital Signs BP 114/71 (BP Location: Right Arm)    Pulse (!) 102    Temp 98.3 F (36.8 C) (Oral)    Resp 20    Ht 5\' 5"  (1.651 m)    Wt 78.5 kg    SpO2 98%    BMI 28.79 kg/m  Physical Exam Vitals and nursing note reviewed.  Constitutional:      Appearance: She is well-developed.  HENT:     Head: Normocephalic and atraumatic.     Jaw: No trismus.     Right Ear: Tympanic membrane, ear canal and external ear normal. No middle ear effusion. Tympanic membrane is not erythematous.     Left Ear: Ear canal and external ear normal. A middle ear effusion is present. Tympanic membrane is not erythematous.     Nose: Mucosal edema and congestion present. No rhinorrhea.     Right Turbinates: Swollen.     Left Turbinates: Swollen.     Right Sinus: No maxillary sinus tenderness or frontal sinus tenderness.     Left Sinus: Maxillary sinus tenderness present. No frontal sinus tenderness.     Mouth/Throat:     Mouth: Mucous membranes are moist. Mucous membranes are not  dry. No oral lesions.     Pharynx: Uvula midline. No oropharyngeal exudate, posterior oropharyngeal erythema or uvula swelling.     Tonsils: No tonsillar abscesses.  Eyes:     General:        Right eye: No discharge.        Left eye: No discharge.     Conjunctiva/sclera: Conjunctivae normal.  Cardiovascular:     Rate and Rhythm: Normal rate and regular rhythm.     Heart sounds: Normal heart sounds.  Pulmonary:     Effort: Pulmonary effort is normal. No respiratory distress.     Breath sounds: Normal breath sounds. No wheezing or rales.  Abdominal:     Palpations: Abdomen is soft.     Tenderness: There is no abdominal tenderness.  Musculoskeletal:     Cervical  back: Normal range of motion and neck supple.  Lymphadenopathy:     Cervical: No cervical adenopathy.  Skin:    General: Skin is warm and dry.  Neurological:     Mental Status: She is alert.  Psychiatric:        Mood and Affect: Mood normal.    ED Results / Procedures / Treatments   Labs (all labs ordered are listed, but only abnormal results are displayed) Labs Reviewed - No data to display  EKG None  Radiology No results found.  Procedures Procedures    Medications Ordered in ED Medications - No data to display  ED Course/ Medical Decision Making/ A&P    Patient seen and examined. Plan discussed with patient.   Labs: None indicated  Imaging: None indicated  Medications/Fluids: Plan prescription for Flonase, saline nasal sprays, loratadine for left maxillary sinusitis in setting of recent positive influenza test.  No negation for antibiotics.  Encouraged good hydration and continued use of Tylenol.  Vital signs reviewed and are as follows: BP 114/71 (BP Location: Right Arm)    Pulse (!) 102    Temp 98.3 F (36.8 C) (Oral)    Resp 20    Ht 5\' 5"  (1.651 m)    Wt 78.5 kg    SpO2 98%    BMI 28.79 kg/m   Initial impression: Influenza A, left maxillary sinusitis, left middle ear effusion without infection  Patient urged to return with worsening symptoms or other concerns. Patient verbalized understanding and agrees with plan.                            Medical Decision Making  Patient in third trimester pregnancy, developed flu symptoms about a week ago.  Returns to the emergency today for ear pain and sinus pressure.  Symptomatic treatment as above.  Patient appears well, afebrile, not septic.  No indication for antibiotics.          Final Clinical Impression(s) / ED Diagnoses Final diagnoses:  Acute non-recurrent maxillary sinusitis  Influenza A  Left ear pain    Rx / DC Orders ED Discharge Orders          Ordered    fluticasone (FLONASE) 50  MCG/ACT nasal spray  Daily        05/06/21 0003    sodium chloride (OCEAN) 0.65 % SOLN nasal spray  As needed        05/06/21 0003    loratadine (CLARITIN) 10 MG tablet  Daily        05/06/21 0005  Renne Crigler, PA-C 05/06/21 Maudie Mercury, MD 05/06/21 [phone]

## 2021-05-06 NOTE — Discharge Instructions (Signed)

## 2021-05-06 NOTE — ED Notes (Signed)
Discharge instructions discussed with pt. Pt verbalized understanding. Pt stable and ambulatory.  °

## 2021-12-20 ENCOUNTER — Emergency Department (HOSPITAL_BASED_OUTPATIENT_CLINIC_OR_DEPARTMENT_OTHER): Payer: Medicaid Other

## 2021-12-20 ENCOUNTER — Encounter (HOSPITAL_BASED_OUTPATIENT_CLINIC_OR_DEPARTMENT_OTHER): Payer: Self-pay | Admitting: Urology

## 2021-12-20 ENCOUNTER — Emergency Department (HOSPITAL_BASED_OUTPATIENT_CLINIC_OR_DEPARTMENT_OTHER)
Admission: EM | Admit: 2021-12-20 | Discharge: 2021-12-20 | Disposition: A | Discharge status: Home / Self Care | Payer: Medicaid Other | Attending: Emergency Medicine | Admitting: Emergency Medicine

## 2021-12-20 DIAGNOSIS — R519 Headache, unspecified: Secondary | ICD-10-CM | POA: Diagnosis present

## 2021-12-20 DIAGNOSIS — G43909 Migraine, unspecified, not intractable, without status migrainosus: Secondary | ICD-10-CM

## 2021-12-20 MED ORDER — DIPHENHYDRAMINE HCL 50 MG/ML IJ SOLN
25.0000 mg | Freq: Once | INTRAMUSCULAR | Status: AC
  Administered 2021-12-20: 25 mg via INTRAVENOUS
  Filled 2021-12-20: qty 1

## 2021-12-20 MED ORDER — PROCHLORPERAZINE EDISYLATE 10 MG/2ML IJ SOLN
10.0000 mg | Freq: Once | INTRAMUSCULAR | Status: AC
  Administered 2021-12-20: 10 mg via INTRAVENOUS
  Filled 2021-12-20: qty 2

## 2021-12-20 MED ORDER — SODIUM CHLORIDE 0.9 % IV BOLUS
1000.0000 mL | Freq: Once | INTRAVENOUS | Status: AC
  Administered 2021-12-20: 1000 mL via INTRAVENOUS

## 2021-12-20 MED ORDER — ACETAMINOPHEN 500 MG PO TABS
1000.0000 mg | ORAL_TABLET | Freq: Once | ORAL | Status: AC
  Administered 2021-12-20: 1000 mg via ORAL
  Filled 2021-12-20: qty 2

## 2021-12-20 NOTE — ED Triage Notes (Signed)
Pt states migraine since yesterday with associated N/V, states light and sound sensitivity  H/o same

## 2021-12-20 NOTE — Discharge Instructions (Addendum)
It was a pleasure taking care of you today!  Your CT head did not show any concerning findings.  You may continue to take your prescription medications as prescribed.  Ensure to maintain fluid intake with water.  You may follow-up with your primary care provider regarding today's ED visit.  Return to the emergency department for experiencing increasing/worsening headache, vision changes, fever, worsening symptoms.

## 2021-12-20 NOTE — ED Notes (Signed)
Patient transported to CT 

## 2021-12-20 NOTE — ED Provider Notes (Signed)
[facility] EMERGENCY DEPARTMENT Provider Note   CSN: [id number] Arrival date & time: 12/20/21  1944     History  Chief Complaint  Patient presents with   Migraine    Yolanda Tate is a 26 y.o. female with a past medical history of migraines who presents to the emergency department with concerns for migraine onset today.  Had a migraine yesterday that was alleviated with her prescription sumatriptan.  Has associated photophobia and phonophobia.  Denies blurred vision, double vision, numbness, tingling, weakness, nausea, vomiting.   The history is provided by the patient. No language interpreter was used.       Home Medications Prior to Admission medications   Medication Sig Start Date End Date Taking? Authorizing Provider  albuterol (VENTOLIN HFA) 108 (90 Base) MCG/ACT inhaler Inhale 1-2 puffs into the lungs every 6 (six) hours as needed for wheezing or shortness of breath. 03/09/21   Charlynne Pander, MD  fluticasone (FLONASE) 50 MCG/ACT nasal spray Place 2 sprays into both nostrils daily. 05/06/21   Renne Crigler, PA-C  loratadine (CLARITIN) 10 MG tablet Take 1 tablet (10 mg total) by mouth daily. 05/06/21   Renne Crigler, PA-C  sodium chloride (OCEAN) 0.65 % SOLN nasal spray Place 1 spray into both nostrils as needed for congestion. 05/06/21   Renne Crigler, PA-C      Allergies    Patient has no known allergies.    Review of Systems   Review of Systems  Eyes:  Positive for photophobia. Negative for visual disturbance.  Gastrointestinal:  Negative for nausea and vomiting.  Neurological:  Negative for weakness and numbness.       -tingling  All other systems reviewed and are negative.   Physical Exam Updated Vital Signs BP 115/77 (BP Location: Right Arm)   Pulse 96   Temp 99.5 F (37.5 C) (Oral)   Resp 18   Ht 5\' 5"  (1.651 m)   Wt 78.5 kg   LMP 12/20/2021 (Exact Date)   SpO2 99%   BMI 28.80 kg/m  Physical Exam Vitals and nursing note reviewed.   Constitutional:      General: She is not in acute distress.    Appearance: She is not diaphoretic.  HENT:     Head: Normocephalic and atraumatic.     Mouth/Throat:     Pharynx: No oropharyngeal exudate.  Eyes:     General: No scleral icterus.    Conjunctiva/sclera: Conjunctivae normal.  Cardiovascular:     Rate and Rhythm: Normal rate and regular rhythm.     Pulses: Normal pulses.     Heart sounds: Normal heart sounds.  Pulmonary:     Effort: Pulmonary effort is normal. No respiratory distress.     Breath sounds: Normal breath sounds. No wheezing.  Abdominal:     General: Bowel sounds are normal.     Palpations: Abdomen is soft. There is no mass.     Tenderness: There is no abdominal tenderness. There is no guarding or rebound.  Musculoskeletal:        General: Normal range of motion.     Cervical back: Normal range of motion and neck supple.  Skin:    General: Skin is warm and dry.  Neurological:     General: No focal deficit present.     Mental Status: She is alert.     Cranial Nerves: Cranial nerves 2-12 are intact.     Sensory: Sensation is intact. No sensory deficit.     Motor: Motor  function is intact. No pronator drift.     Coordination: Coordination is intact.     Gait: Gait is intact.  Psychiatric:        Behavior: Behavior normal.     ED Results / Procedures / Treatments   Labs (all labs ordered are listed, but only abnormal results are displayed) Labs Reviewed  PREGNANCY, URINE    EKG None  Radiology CT Head Wo Contrast  Result Date: 12/20/2021 CLINICAL DATA:  Chronic headaches EXAM: CT HEAD WITHOUT CONTRAST TECHNIQUE: Contiguous axial images were obtained from the base of the skull through the vertex without intravenous contrast. RADIATION DOSE REDUCTION: This exam was performed according to the departmental dose-optimization program which includes automated exposure control, adjustment of the mA and/or kV according to patient size and/or use of  iterative reconstruction technique. COMPARISON:  None Available. FINDINGS: Brain: No acute intracranial findings are seen. There are no signs of bleeding within the cranium. Ventricles are not dilated. There is no focal edema or mass effect. Vascular: Unremarkable. Skull: Unremarkable. Sinuses/Orbits: Unremarkable. Other: None. IMPRESSION: No acute intracranial findings are seen in noncontrast CT brain. Electronically Signed   By: Ernie Avena M.D.   On: 12/20/2021 20:57    Procedures Procedures    Medications Ordered in ED Medications  sodium chloride 0.9 % bolus 1,000 mL (1,000 mLs Intravenous New Bag/Given 12/20/21 2049)  prochlorperazine (COMPAZINE) injection 10 mg (10 mg Intravenous Given 12/20/21 2049)  diphenhydrAMINE (BENADRYL) injection 25 mg (25 mg Intravenous Given 12/20/21 2049)  acetaminophen (TYLENOL) tablet 1,000 mg (1,000 mg Oral Given 12/20/21 2049)    ED Course/ Medical Decision Making/ A&P Clinical Course as of 12/20/21 2157  Sun Dec 20, 2021  2128 Reevaluated and asleep on stretcher.  Patient notes improvement of headache with symptom regimen in the ED.  Discussed discharge treatment plan with patient at bedside.  Patient will finish up the fluids and will be safely discharged home. [SB]    Clinical Course User Index [SB] Mathis Cashman A, PA-C                           Medical Decision Making Amount and/or Complexity of Data Reviewed Labs: ordered. Radiology: ordered.  Risk OTC drugs. Prescription drug management.   Pt presented to the ED with migraine headache onset yesterday that was alleviated with sumatriptan.  Had another migraine today however did not take her sumatriptan.  Has associated photophobia and phonophobia.  On exam patient without focal neurological deficits, able to ambulate without assistance or difficulty.  Negative pronator drift.  Differential diagnosis includes SAH, ICH, migraine, tension headache.   Labs:  I ordered, and personally  interpreted labs.  The pertinent results include:   Pregnancy urine negative  Imaging: I ordered imaging studies including CT head I independently visualized and interpreted imaging which showed: No acute intracranial abnormalities I agree with the radiologist interpretation  Medications:  I ordered medication including IV fluids, Compazine, Benadryl, Tylenol for symptom management  Reevaluation of the patient after these medicines and interventions, I reevaluated the patient and found that they have improved I have reviewed the patients home medicines and have made adjustments as needed    Disposition: Presentation suspicious for migraine headache. Presentation less likely due to [facility] or ICH due to the absence of red flags.  After consideration of the diagnostic results and the patients response to treatment, I feel that the patient would benefit from Discharge home. Will send prescription for  ibuprofen and Zofran to take as directed.  Discussed with patient they may follow-up with their primary care provider as needed.  Supportive care measures and strict return precautions discussed with patient.  Patient knowledges and verbalized understanding.  Patient agreeable to discharge treatment plan. Patient appears safe for discharge at this time.  Follow-up as indicated in the discharge paperwork.   This chart was dictated using voice recognition software, Dragon. Despite the best efforts of this provider to proofread and correct errors, errors may still occur which can change documentation meaning.  Final Clinical Impression(s) / ED Diagnoses Final diagnoses:  Migraine without status migrainosus, not intractable, unspecified migraine type    Rx / DC Orders ED Discharge Orders     None         Clarity Ciszek A, PA-C 12/20/21 2159    Virgina Norfolk, DO 12/20/21 2226

## 2022-03-17 ENCOUNTER — Encounter (HOSPITAL_BASED_OUTPATIENT_CLINIC_OR_DEPARTMENT_OTHER): Payer: Self-pay | Admitting: Emergency Medicine

## 2022-03-17 ENCOUNTER — Emergency Department (HOSPITAL_BASED_OUTPATIENT_CLINIC_OR_DEPARTMENT_OTHER)
Admission: EM | Admit: 2022-03-17 | Discharge: 2022-03-17 | Disposition: A | Discharge status: Home / Self Care | Payer: Medicaid Other | Attending: Emergency Medicine | Admitting: Emergency Medicine

## 2022-03-17 ENCOUNTER — Emergency Department (HOSPITAL_BASED_OUTPATIENT_CLINIC_OR_DEPARTMENT_OTHER): Payer: Medicaid Other

## 2022-03-17 ENCOUNTER — Other Ambulatory Visit: Payer: Self-pay

## 2022-03-17 DIAGNOSIS — M25511 Pain in right shoulder: Secondary | ICD-10-CM | POA: Insufficient documentation

## 2022-03-17 DIAGNOSIS — W06XXXA Fall from bed, initial encounter: Secondary | ICD-10-CM | POA: Diagnosis not present

## 2022-03-17 MED ORDER — MELOXICAM 15 MG PO TABS: 15.0000 mg | ORAL_TABLET | Freq: Every day | ORAL | 0 refills | Status: AC

## 2022-03-17 NOTE — ED Provider Notes (Signed)
[facility] EMERGENCY DEPARTMENT Provider Note   CSN: [id number] Arrival date & time: 03/17/22  1855     History  Chief Complaint  Patient presents with   Shoulder Injury    Yolanda Tate is a 26 y.o. female.  Patient presents complaining of right-sided shoulder pain secondary to a fall.  Patient states she was jumping tried to catch her child was falling off the bed when she landed directly on the right shoulder.  She complains of pain with active range of motion.  She denies hitting her head denies losing consciousness.  Past medical history significant for history of migraines  HPI     Home Medications Prior to Admission medications   Medication Sig Start Date End Date Taking? Authorizing Provider  meloxicam (MOBIC) 15 MG tablet Take 1 tablet (15 mg total) by mouth daily. 03/17/22  Yes Darrick Grinder, PA-C  albuterol (VENTOLIN HFA) 108 (90 Base) MCG/ACT inhaler Inhale 1-2 puffs into the lungs every 6 (six) hours as needed for wheezing or shortness of breath. 03/09/21   Charlynne Pander, MD  fluticasone (FLONASE) 50 MCG/ACT nasal spray Place 2 sprays into both nostrils daily. 05/06/21   Renne Crigler, PA-C  loratadine (CLARITIN) 10 MG tablet Take 1 tablet (10 mg total) by mouth daily. 05/06/21   Renne Crigler, PA-C  sodium chloride (OCEAN) 0.65 % SOLN nasal spray Place 1 spray into both nostrils as needed for congestion. 05/06/21   Renne Crigler, PA-C      Allergies    Patient has no known allergies.    Review of Systems   Review of Systems  Musculoskeletal:  Positive for arthralgias.    Physical Exam Updated Vital Signs BP 116/60 (BP Location: Left Arm)   Pulse 89   Temp 99.4 F (37.4 C)   Resp 18   Ht 5\' 5"  (1.651 m)   Wt 68 kg   LMP 02/15/2022 (Approximate)   SpO2 100%   BMI 24.96 kg/m  Physical Exam HENT:     Head: Normocephalic and atraumatic.  Eyes:     Conjunctiva/sclera: Conjunctivae normal.  Cardiovascular:     Rate and Rhythm:  Normal rate.  Pulmonary:     Effort: Pulmonary effort is normal. No respiratory distress.  Musculoskeletal:        General: No signs of injury.     Cervical back: Normal range of motion.     Comments: Patient with mild pain with abduction, internal rotation, and external rotation of the right shoulder.  Tenderness over the right AC joint.  Skin:    General: Skin is dry.  Neurological:     Mental Status: She is alert.  Psychiatric:        Speech: Speech normal.        Behavior: Behavior normal.     ED Results / Procedures / Treatments   Labs (all labs ordered are listed, but only abnormal results are displayed) Labs Reviewed - No data to display  EKG None  Radiology DG Shoulder Right  Result Date: 03/17/2022 CLINICAL DATA:  Right shoulder injury EXAM: RIGHT SHOULDER - 2+ VIEW COMPARISON:  None Available. FINDINGS: Internal rotation, external rotation, transscapular views of the right shoulder are obtained. No fracture, subluxation, or dislocation. Joint spaces are well preserved. Soft tissues are normal. Visualized portions of the right chest are clear. IMPRESSION: 1. Unremarkable right shoulder. Electronically Signed   By: 03/19/2022 M.D.   On: 03/17/2022 19:28    Procedures .Ortho Injury Treatment  Date/Time: 03/17/2022 8:10 PM  Performed by: Dorothyann Peng, PA-C Authorized by: Dorothyann Peng, PA-C   Consent:    Consent obtained:  Verbal   Consent given by:  Patient   Risks discussed:  Restricted joint movement, stiffness, vascular damage and nerve damage   Alternatives discussed:  No treatmentInjury location: shoulder Location details: right shoulder Injury type: soft tissue Pre-procedure neurovascular assessment: neurovascularly intact Immobilization: sling Splint Applied by: ED Tech Post-procedure neurovascular assessment: post-procedure neurovascularly intact       Medications Ordered in ED Medications - No data to display  ED Course/ Medical  Decision Making/ A&P                           Medical Decision Making Amount and/or Complexity of Data Reviewed Radiology: ordered.   Patient presents with a chief complaint of right shoulder pain.  Differential diagnosis includes but is not limited to fracture, dislocation, soft tissue injury, rotator cuff tears, and others  I reviewed the patient's past medical history and found no relevant visits  There is no indication at this time for lab work  I ordered and interpreted imaging including plain films of the right shoulder.  Images showed no fracture or dislocation.  I agree with the radiologist findings.  I had the patient placed in a sling for comfort.  Imaging shows no acute fracture or dislocation.  The patient may have a mild AC joint separation based on physical exam, may also have potentially minor rotator cuff injury.  Plan to discharge patient home with anti-inflammatory medicine, sling, and recommendation for orthopedic follow-up as needed.        Final Clinical Impression(s) / ED Diagnoses Final diagnoses:  Acute pain of right shoulder    Rx / DC Orders ED Discharge Orders          Ordered    meloxicam (MOBIC) 15 MG tablet  Daily        03/17/22 2012              Ronny Bacon 03/17/22 2013    Margette Fast, MD 03/19/22 [phone]

## 2022-03-17 NOTE — ED Triage Notes (Signed)
Right shoulder pain since this morning. Started she "landed on in wrong"

## 2022-03-17 NOTE — ED Notes (Signed)
D/c paperwork reviewed with pt, including f/u care. Pt with no questions or concerns at time of d/c. Pt ambulatory to ED exit with family, NAD.

## 2022-03-17 NOTE — Discharge Instructions (Signed)
You were seen today for right shoulder pain.  Your images were reassuring for no fracture or dislocation.  The sling you are in is for comfort.  You may remove it if you find it burdensome.  I have prescribed a short course of a medication called meloxicam.  Do not take other NSAID medications such as ibuprofen or Aleve while taking this medication.  You may still take Tylenol as needed.  Please contact the listed orthopedic provider to schedule a follow-up appointment as needed

## 2023-02-19 ENCOUNTER — Encounter (HOSPITAL_BASED_OUTPATIENT_CLINIC_OR_DEPARTMENT_OTHER): Payer: Self-pay | Admitting: Emergency Medicine

## 2023-02-19 ENCOUNTER — Emergency Department (HOSPITAL_BASED_OUTPATIENT_CLINIC_OR_DEPARTMENT_OTHER): Payer: Medicaid Other

## 2023-02-19 ENCOUNTER — Other Ambulatory Visit: Payer: Self-pay

## 2023-02-19 ENCOUNTER — Emergency Department (HOSPITAL_BASED_OUTPATIENT_CLINIC_OR_DEPARTMENT_OTHER)
Admission: EM | Admit: 2023-02-19 | Discharge: 2023-02-19 | Disposition: A | Discharge status: Home / Self Care | Payer: Medicaid Other | Attending: Emergency Medicine | Admitting: Emergency Medicine

## 2023-02-19 DIAGNOSIS — M25512 Pain in left shoulder: Secondary | ICD-10-CM | POA: Diagnosis present

## 2023-02-19 DIAGNOSIS — W108XXA Fall (on) (from) other stairs and steps, initial encounter: Secondary | ICD-10-CM | POA: Diagnosis not present

## 2023-02-19 DIAGNOSIS — W19XXXA Unspecified fall, initial encounter: Secondary | ICD-10-CM

## 2023-02-19 LAB — PREGNANCY, URINE: Preg Test, Ur: NEGATIVE

## 2023-02-19 MED ORDER — NAPROXEN 500 MG PO TABS: 500.0000 mg | ORAL_TABLET | Freq: Two times a day (BID) | ORAL | 0 refills | Status: AC

## 2023-02-19 MED ORDER — NAPROXEN 250 MG PO TABS
500.0000 mg | ORAL_TABLET | Freq: Once | ORAL | Status: AC
  Administered 2023-02-19: 500 mg via ORAL
  Filled 2023-02-19: qty 2

## 2023-02-19 MED ORDER — ACETAMINOPHEN 500 MG PO TABS
1000.0000 mg | ORAL_TABLET | Freq: Once | ORAL | Status: AC
  Administered 2023-02-19: 1000 mg via ORAL
  Filled 2023-02-19: qty 2

## 2023-02-19 NOTE — ED Provider Notes (Signed)
[facility] Provider Note   CSN: [id number] Arrival date & time: 02/19/23  2118     History  Chief Complaint  Patient presents with   Shoulder Pain    Yolanda Tate is a 27 y.o. female.  The history is provided by the patient and the spouse.  Shoulder Pain Location:  Shoulder Shoulder location:  L shoulder Injury: yes   Time since incident:  6 hours Mechanism of injury: fall   Fall:    Fall occurred: hit chairs with left shoulder. Pain details:    Radiates to:  Does not radiate   Severity:  Moderate   Onset quality:  Sudden   Timing:  Constant   Progression:  Unchanged Foreign body present:  No foreign bodies Relieved by:  Nothing Worsened by:  Nothing Ineffective treatments:  None tried Associated symptoms: no back pain, no fever, no muscle weakness, no numbness and no swelling        Home Medications Prior to Admission medications   Medication Sig Start Date End Date Taking? Authorizing Provider  naproxen (NAPROSYN) 500 MG tablet Take 1 tablet (500 mg total) by mouth 2 (two) times daily with a meal. 02/19/23  Yes Vaanya Shambaugh, MD  albuterol (VENTOLIN HFA) 108 (90 Base) MCG/ACT inhaler Inhale 1-2 puffs into the lungs every 6 (six) hours as needed for wheezing or shortness of breath. 03/09/21   Charlynne Pander, MD  fluticasone (FLONASE) 50 MCG/ACT nasal spray Place 2 sprays into both nostrils daily. 05/06/21   Renne Crigler, PA-C  loratadine (CLARITIN) 10 MG tablet Take 1 tablet (10 mg total) by mouth daily. 05/06/21   Renne Crigler, PA-C  meloxicam (MOBIC) 15 MG tablet Take 1 tablet (15 mg total) by mouth daily. 03/17/22   Darrick Grinder, PA-C  sodium chloride (OCEAN) 0.65 % SOLN nasal spray Place 1 spray into both nostrils as needed for congestion. 05/06/21   Renne Crigler, PA-C      Allergies    Patient has no known allergies.    Review of Systems   Review of Systems  Constitutional:  Negative for  fever.  Respiratory:  Negative for wheezing and stridor.   Gastrointestinal:  Negative for vomiting.  Musculoskeletal:  Positive for arthralgias. Negative for back pain.  All other systems reviewed and are negative.   Physical Exam Updated Vital Signs BP 121/75 (BP Location: Right Arm)   Pulse 98   Temp 97.8 F (36.6 C) (Oral)   Resp 16   Ht 5\' 5"  (1.651 m)   Wt 68.9 kg   LMP 01/29/2023   SpO2 100%   BMI 25.29 kg/m  Physical Exam Vitals and nursing note reviewed.  Constitutional:      General: She is not in acute distress.    Appearance: She is well-developed.  HENT:     Head: Normocephalic and atraumatic.  Eyes:     Pupils: Pupils are equal, round, and reactive to light.  Cardiovascular:     Rate and Rhythm: Normal rate and regular rhythm.     Pulses: Normal pulses.     Heart sounds: Normal heart sounds.  Pulmonary:     Effort: Pulmonary effort is normal. No respiratory distress.     Breath sounds: Normal breath sounds.  Abdominal:     General: Bowel sounds are normal. There is no distension.     Palpations: Abdomen is soft.     Tenderness: There is no abdominal tenderness. There is no guarding or  rebound.  Musculoskeletal:        General: Normal range of motion.     Left shoulder: Normal. No swelling, deformity, effusion, laceration, bony tenderness or crepitus. Normal strength. Normal pulse.     Left upper arm: Normal.     Left elbow: Normal. No swelling, deformity or effusion. Normal range of motion.     Left forearm: Normal. No tenderness or bony tenderness.     Left wrist: No tenderness or snuff box tenderness. Normal range of motion. Normal pulse.     Left hand: Normal. Normal range of motion. Normal capillary refill.     Cervical back: Neck supple.     Comments: No winging of the left scapula, negative NEERs tests of the Left shoulder, FROM of the left shoulder. LUE 5/5 strength, intact L biceps and triceps tendons.  DTR 2+ in LUE.  Intact pronation and  supination of the left forearm   Skin:    General: Skin is warm and dry.     Capillary Refill: Capillary refill takes less than 2 seconds.     Findings: No erythema or rash.  Neurological:     General: No focal deficit present.     Mental Status: She is oriented to person, place, and time.     Motor: No weakness.     Deep Tendon Reflexes: Reflexes normal.  Psychiatric:        Mood and Affect: Mood normal.     ED Results / Procedures / Treatments   Labs (all labs ordered are listed, but only abnormal results are displayed) Labs Reviewed  PREGNANCY, URINE    EKG None  Radiology DG Shoulder Left  Result Date: 02/19/2023 CLINICAL DATA:  Status post fall with left shoulder pain. Decreased range of motion. EXAM: LEFT SHOULDER - 2+ VIEW COMPARISON:  None Available. FINDINGS: There is no evidence of fracture or dislocation. The alignment joint spaces are preserved. Clavicle is intact. There is no evidence of arthropathy or other focal bone abnormality. Soft tissues are unremarkable. IMPRESSION: Negative radiographs of the left shoulder. Electronically Signed   By: Narda Rutherford M.D.   On: 02/19/2023 23:18    Procedures Procedures    Medications Ordered in ED Medications  acetaminophen (TYLENOL) tablet 1,000 mg (has no administration in time range)  naproxen (NAPROSYN) tablet 500 mg (has no administration in time range)    ED Course/ Medical Decision Making/ A&P                                 Medical Decision Making Patient fell onto chairs on left shoulder   Amount and/or Complexity of Data Reviewed Independent Historian: spouse    Details: See above  External Data Reviewed: notes.    Details: Previous notes reviewed  Labs: ordered.    Details: Pregnancy is negative  Radiology: ordered and independent interpretation performed.    Details: No fracture or dislocation by me   Risk OTC drugs. Prescription drug management. Risk Details: Larey Seat onto chairs.  No  fractures or dislocations nor effusions on Xray.  This is likely soft tissue.  Ice, gentle range of motion and NSAIDS.  Follow up with orthopedics if symptoms persist.      Final Clinical Impression(s) / ED Diagnoses Final diagnoses:  Fall, initial encounter  Acute pain of left shoulder  Return for intractable cough, coughing up blood, fevers > 100.4 unrelieved by medication, shortness of breath, intractable vomiting, chest  pain, shortness of breath, weakness, numbness, changes in speech, facial asymmetry, abdominal pain, passing out, Inability to tolerate liquids or food, cough, altered mental status or any concerns. No signs of systemic illness or infection. The patient is nontoxic-appearing on exam and vital signs are within normal limits.  I have reviewed the triage vital signs and the nursing notes. Pertinent labs & imaging results that were available during my care of the patient were reviewed by me and considered in my medical decision making (see chart for details). After history, exam, and medical workup I feel the patient has been appropriately medically screened and is safe for discharge home. Pertinent diagnoses were discussed with the patient. Patient was given return precautions.   Rx / DC Orders ED Discharge Orders          Ordered    naproxen (NAPROSYN) 500 MG tablet  2 times daily with meals        02/19/23 2316              Dannetta Lekas, MD 02/19/23 2347

## 2023-02-19 NOTE — ED Triage Notes (Signed)
Pt fell backward onto stairs today; c/o LT shoulder pain

## 2023-02-19 NOTE — ED Provider Notes (Signed)
Husband is angry that patient had a previous injury to the opposite shoulder and was sent home and she continued to have pain.  I politely explained that this is a different event and that we would be starting medication and that the patient can follow up with orthopedics if she continues to experience pain but that the imaging is negative for fractures, dislocations or effusions and exam does not demonstrate serious injury.     Bernard Donahoo, MD 02/19/23 2349

## 2023-03-19 IMAGING — US US OB LIMITED
1 series · 14 of 28 positions shown · non-contrast
Comparison: Prior outside ultrasound examination 11/24/2020

CLINICAL DATA: Eleven weeks pregnant with vaginal bleeding.

EXAM:
OBSTETRIC <14 WK US AND TRANSVAGINAL OB US
TECHNIQUE: Both transabdominal and transvaginal ultrasound examinations were
performed for complete evaluation of the gestation as well as the
maternal uterus, adnexal regions, and pelvic cul-de-sac.
Transvaginal technique was performed to assess early pregnancy.

[Series 1: us ob limited · 38 acquisitions, 14 frames shown]
[im 2/38]
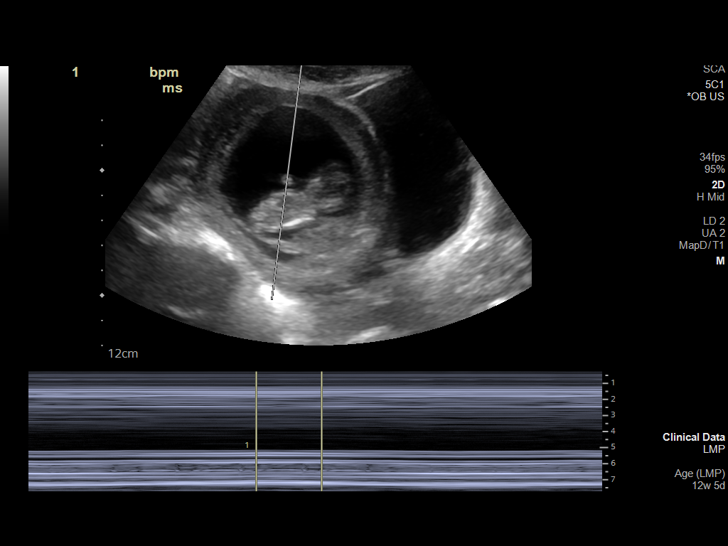
[im 5/38]
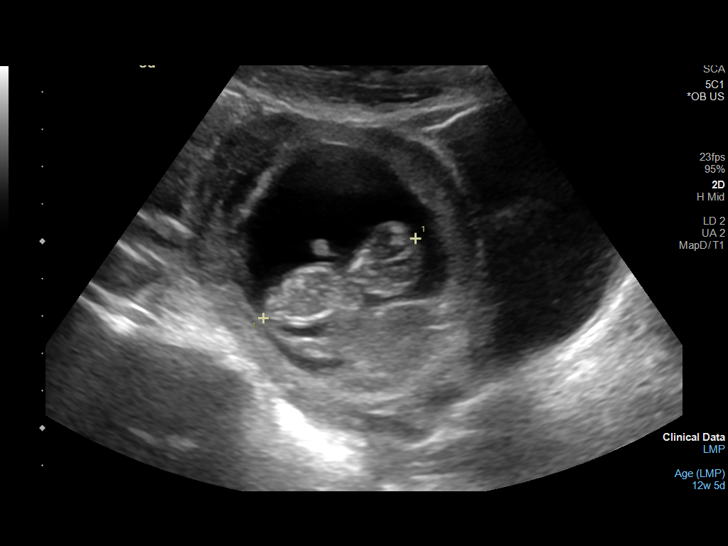
[im 7/38]
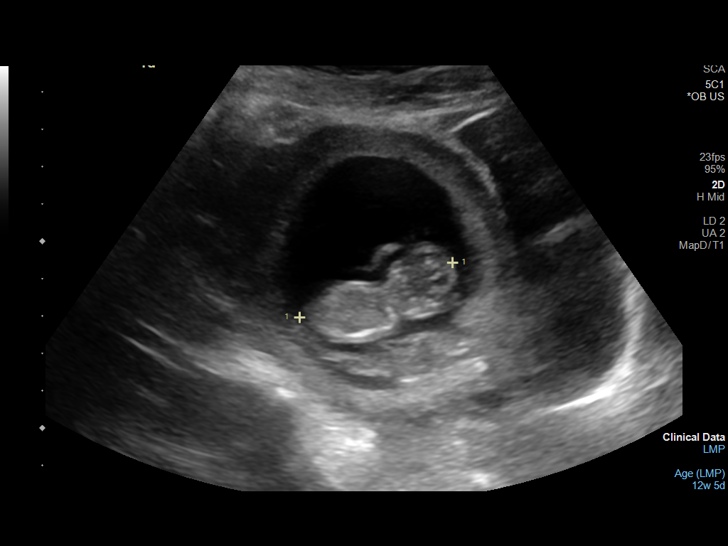
[im 10/38]
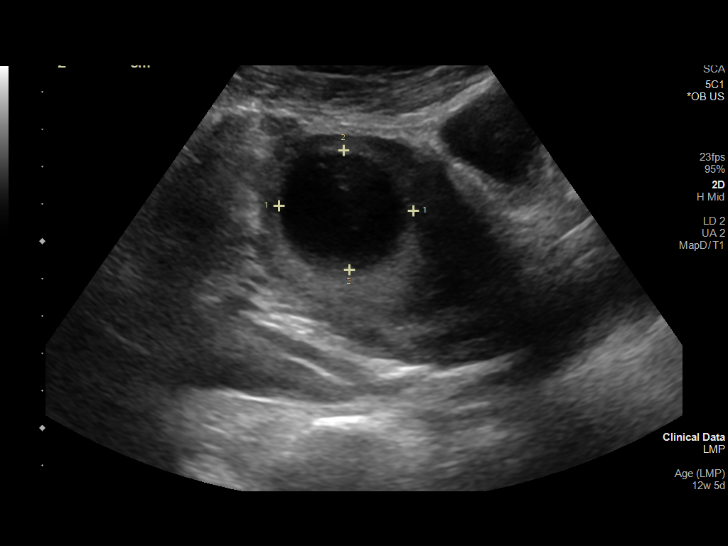
[im 13/38]
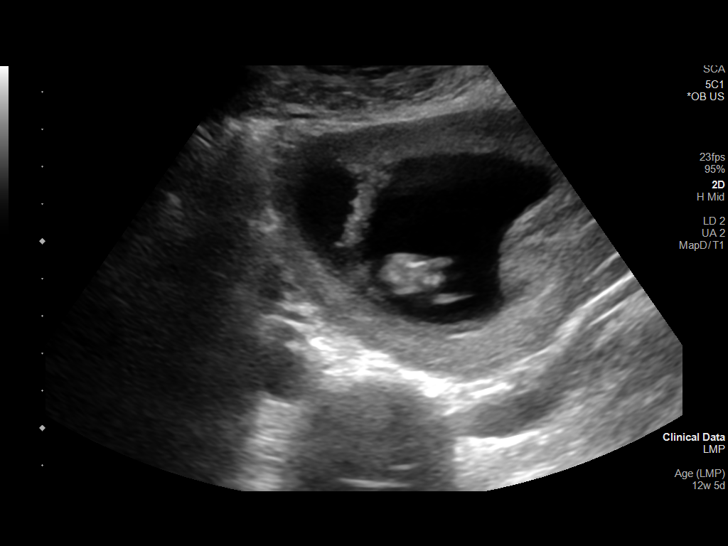
[im 16/38]
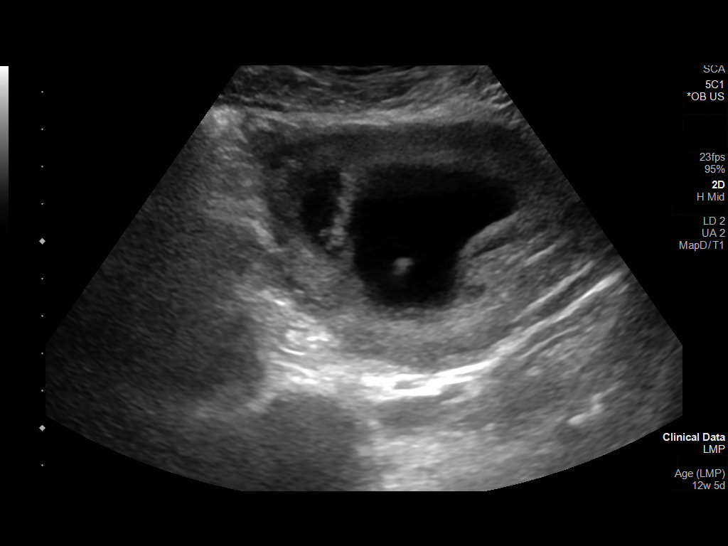
[im 18/38]
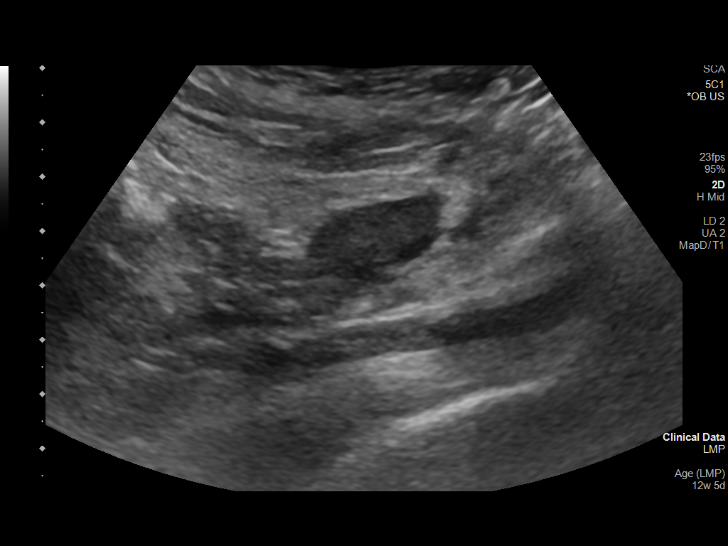
[im 21/38]
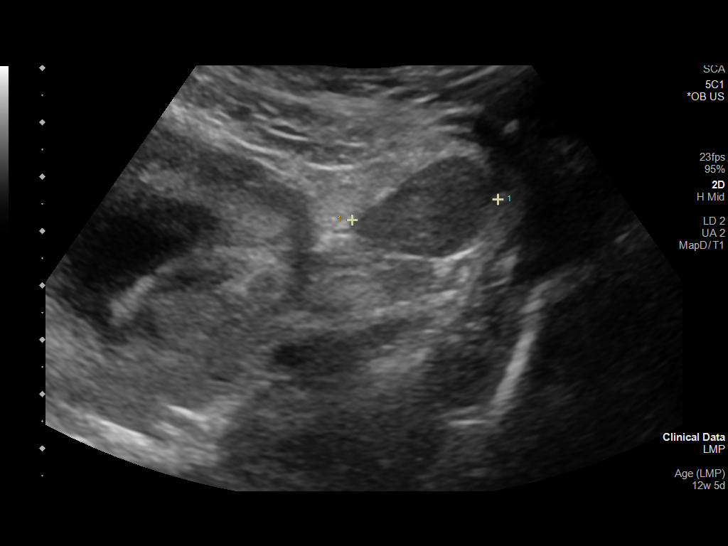
[im 24/38]
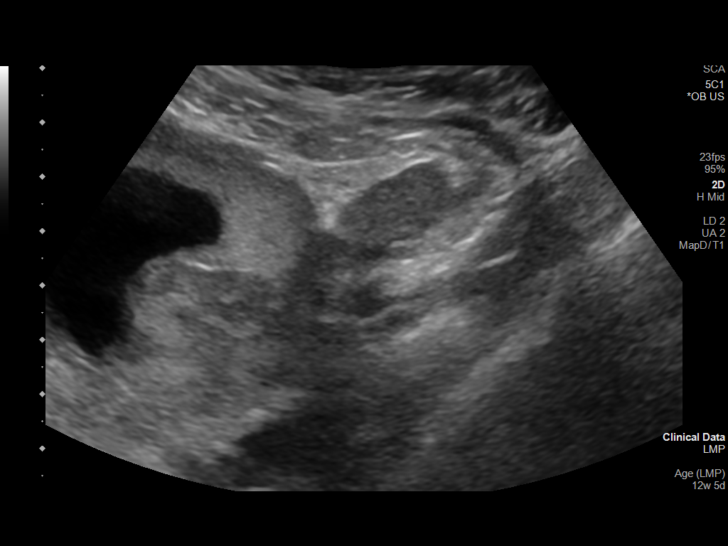
[im 27/38]
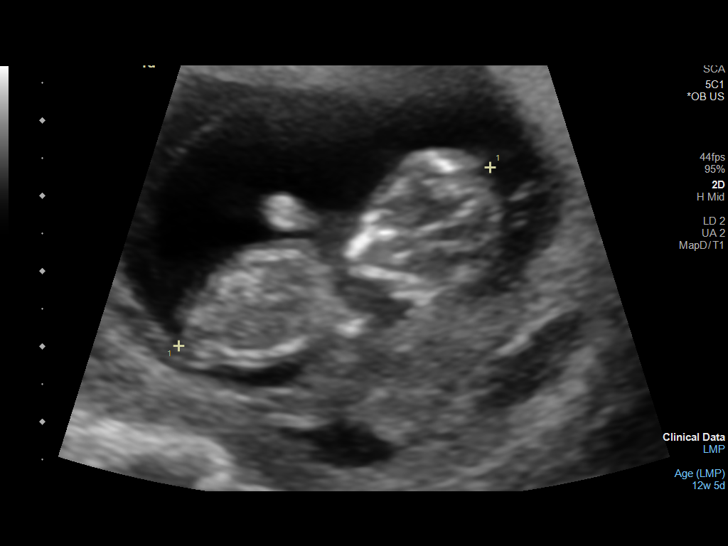
[im 29/38]
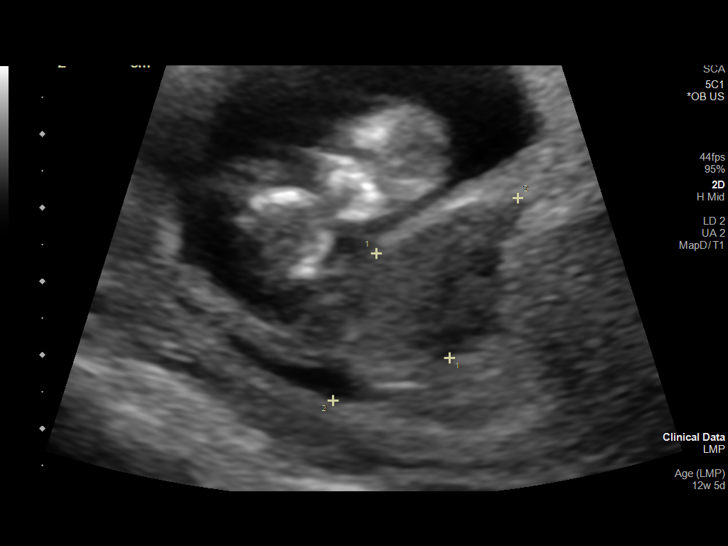
[im 32/38]
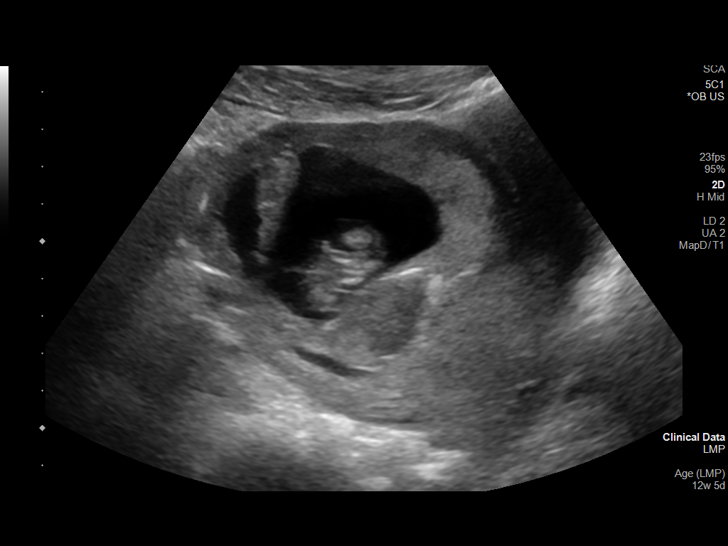
[im 35/38]
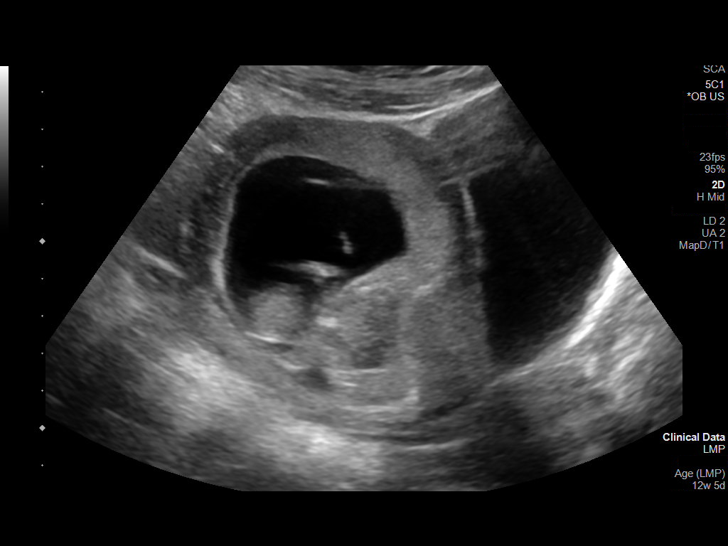
[im 38/38]
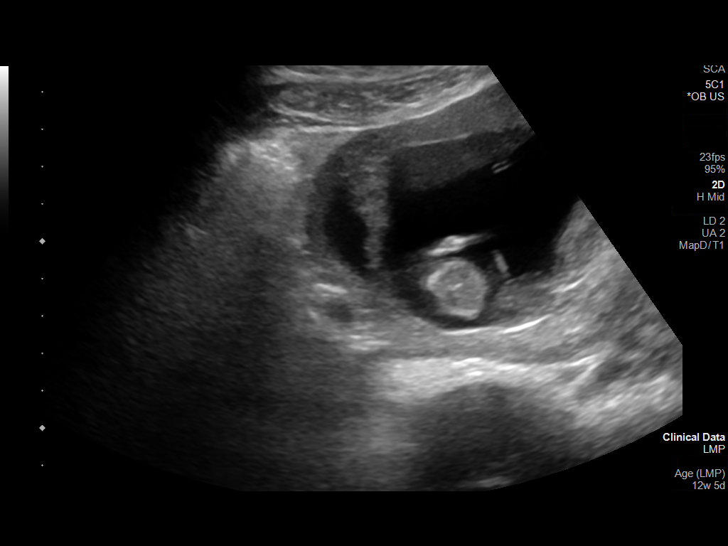

[14 of 28 positions shown; findings below may reference images not displayed]

FINDINGS: Intrauterine gestational sac: Present

Yolk sac:  None

Embryo:  Present

Cardiac Activity: Present

Heart Rate: 157 bpm

CRL:  48 mm   11 w   4 d                  US EDC: 06/13/2021

Subchorionic hemorrhage: Moderate to large focus of subchorionic
hemorrhage. The larger focus is noted laterally and has a maximum
diameter of 2.5 cm. There is also a smaller focus of hemorrhage
inferiorly.

Maternal uterus/adnexae: The left ovary is normal. The right ovary
is not identified. No free pelvic fluid collections.
IMPRESSION: 1. Single living intrauterine fetus estimated at 11 weeks and 4 days
gestation.
2. Moderate to large subchorionic hemorrhage.
3. Normal left ovary.  Right ovary not visualized.

## 2024-06-12 ENCOUNTER — Ambulatory Visit: Payer: Self-pay | Admitting: Obstetrics and Gynecology
